# Patient Record
Sex: Male | Born: 1995 | Hispanic: Yes | Marital: Single | Smoking: Current some day smoker
Health system: Southern US, Community
[De-identification: ages and names within clinical notes are randomized; demographics above are authoritative.]

## PROBLEM LIST (undated history)

## (undated) DIAGNOSIS — R000 Tachycardia, unspecified: Secondary | ICD-10-CM

## (undated) HISTORY — PX: OTHER SURGICAL HISTORY: SHX169

## (undated) HISTORY — PX: FRACTURE SURGERY: SHX138

---
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
## 2013-11-20 ENCOUNTER — Encounter (HOSPITAL_COMMUNITY): Payer: Self-pay | Admitting: Emergency Medicine

## 2013-11-20 ENCOUNTER — Emergency Department (HOSPITAL_COMMUNITY)
Admission: EM | Admit: 2013-11-20 | Discharge: 2013-11-21 | Disposition: A | Discharge status: Home / Self Care | Payer: PRIVATE HEALTH INSURANCE | Attending: Emergency Medicine | Admitting: Emergency Medicine

## 2013-11-20 DIAGNOSIS — H9209 Otalgia, unspecified ear: Secondary | ICD-10-CM | POA: Insufficient documentation

## 2013-11-20 DIAGNOSIS — Y939 Activity, unspecified: Secondary | ICD-10-CM | POA: Insufficient documentation

## 2013-11-20 DIAGNOSIS — H60399 Other infective otitis externa, unspecified ear: Secondary | ICD-10-CM | POA: Insufficient documentation

## 2013-11-20 DIAGNOSIS — Z792 Long term (current) use of antibiotics: Secondary | ICD-10-CM | POA: Insufficient documentation

## 2013-11-20 DIAGNOSIS — IMO0002 Reserved for concepts with insufficient information to code with codable children: Secondary | ICD-10-CM | POA: Insufficient documentation

## 2013-11-20 DIAGNOSIS — F17200 Nicotine dependence, unspecified, uncomplicated: Secondary | ICD-10-CM | POA: Insufficient documentation

## 2013-11-20 DIAGNOSIS — T169XXA Foreign body in ear, unspecified ear, initial encounter: Secondary | ICD-10-CM | POA: Insufficient documentation

## 2013-11-20 DIAGNOSIS — T161XXA Foreign body in right ear, initial encounter: Secondary | ICD-10-CM

## 2013-11-20 DIAGNOSIS — Y929 Unspecified place or not applicable: Secondary | ICD-10-CM | POA: Insufficient documentation

## 2013-11-20 DIAGNOSIS — H6091 Unspecified otitis externa, right ear: Secondary | ICD-10-CM

## 2013-11-20 NOTE — ED Provider Notes (Signed)
CSN: [id number]     Arrival date & time 11/20/13  2206 History  This chart was scribed for Winifred Olive, working with Toy Cookey, MD found by Elon Spanner, ED Scribe. This patient was seen in room TR11C/TR11C and the patient's care was started at 11:48 PM.    Chief Complaint  Patient presents with  . Otalgia   The history is provided by the patient. No language interpreter was used.    HPI Comments: Dennis Atkins is a 18 y.o. male who presents to the Emergency Department complaining of 1 month of gradually worsening right otalgia.  Patient states his ear "popped" recently with pus drainage.  He has used peroxide to periodically clean the affected ear.     History reviewed. No pertinent past medical history. Past Surgical History  Procedure Laterality Date  . Fracture surgery    . Arm surgery     History reviewed. No pertinent family history. History  Substance Use Topics  . Smoking status: Current Every Day Smoker -- 0.50 packs/day  . Smokeless tobacco: Not on file  . Alcohol Use: No    Review of Systems  HENT: Positive for ear discharge and ear pain.   All other systems reviewed and are negative.     Allergies  Review of patient's allergies indicates no known allergies.  Home Medications   Prior to Admission medications   Medication Sig Start Date End Date Taking? Authorizing Provider  clindamycin (CLEOCIN) 300 MG capsule Take 300 mg by mouth 4 (four) times daily. 09/04/12   Ward Givens, MD  HYDROcodone-acetaminophen (NORCO/VICODIN) 5-325 MG per tablet Take 1-2 tablets by mouth every 6 (six) hours as needed for pain. Take 1 or 2 po Q 6hrs for pain 09/04/12   Ward Givens, MD   BP 141/71  Pulse 75  Temp(Src) 98.2 F (36.8 C) (Oral)  Resp 18  SpO2 98% Physical Exam  Nursing note and vitals reviewed. Constitutional: He is oriented to person, place, and time. He appears well-developed and well-nourished. No distress.  HENT:  Head: Normocephalic and  atraumatic.  Clear, soft plastic object noted in right external ear canal. Left ear unremarkable.   Eyes: Conjunctivae are normal.  Neck: Normal range of motion.  Cardiovascular: Normal rate and regular rhythm.  Exam reveals no gallop and no friction rub.   No murmur heard. Pulmonary/Chest: Effort normal and breath sounds normal. He has no wheezes. He has no rales. He exhibits no tenderness.  Musculoskeletal: Normal range of motion.  Neurological: He is alert and oriented to person, place, and time. Coordination normal.  Speech is goal-oriented. Moves limbs without ataxia.   Skin: Skin is warm and dry.  Psychiatric: He has a normal mood and affect. His behavior is normal.    ED Course  FOREIGN BODY REMOVAL Date/Time: 11/21/2013 12:20 AM Performed by: Emilia Beck Authorized by: Emilia Beck Consent: Verbal consent obtained. Risks and benefits: risks, benefits and alternatives were discussed Consent given by: patient Patient understanding: patient states understanding of the procedure being performed Patient consent: the patient's understanding of the procedure matches consent given Patient identity confirmed: verbally with patient Body area: ear Location details: right ear Patient sedated: no Patient restrained: no Patient cooperative: yes Localization method: visualized Removal mechanism: forceps Complexity: simple 1 objects recovered. Objects recovered: plastic ear bud Post-procedure assessment: foreign body removed Patient tolerance: Patient tolerated the procedure well with no immediate complications.   (including critical care time)  DIAGNOSTIC STUDIES: Oxygen Saturation is 98% on RA,  normal by my interpretation.    COORDINATION OF CARE:  11:53 PM Discussed treatment plan with patient.  Patient acknowledges and agrees with plan.    Labs Review Labs Reviewed - No data to display  Imaging Review No results found.   EKG Interpretation None       MDM   Final diagnoses:  Foreign body in ear, right, initial encounter  Otitis externa of right ear    12:19 AM Patient had an ear bud in his right ear that I removed. TM intact after removal. Patient will have antibiotic ear drops for otitis externa. Vitals stable and patient afebrile.   I personally performed the services described in this documentation, which was scribed in my presence. The recorded information has been reviewed and is accurate.    Emilia BeckKaitlyn Kalyn Hofstra, PA-C 11/21/13 0031

## 2013-11-20 NOTE — ED Notes (Signed)
Presents with right ear pain-pt reports purulent drainage from ear. Uses q tips at home

## 2013-11-21 MED ORDER — NEOMYCIN-POLYMYXIN-HC 3.5-10000-1 OT SUSP: 4.0000 [drp] | Freq: Three times a day (TID) | OTIC | Status: DC

## 2013-11-21 NOTE — ED Provider Notes (Signed)
Medical screening examination/treatment/procedure(s) were performed by non-physician practitioner and as supervising physician I was immediately available for consultation/collaboration.  Avalie Oconnor, MD 11/21/13 2034 

## 2013-11-21 NOTE — ED Notes (Signed)
Declined W/C at D/C and was escorted to lobby by RN. 

## 2013-11-21 NOTE — Discharge Instructions (Signed)
Use antibiotic ear drops until symptoms resolve. Refer to attached documents for more information.

## 2013-12-10 ENCOUNTER — Emergency Department (HOSPITAL_COMMUNITY)
Admission: EM | Admit: 2013-12-10 | Discharge: 2013-12-10 | Disposition: A | Discharge status: Home / Self Care | Payer: PRIVATE HEALTH INSURANCE | Attending: Emergency Medicine | Admitting: Emergency Medicine

## 2013-12-10 ENCOUNTER — Encounter (HOSPITAL_COMMUNITY): Payer: Self-pay | Admitting: Emergency Medicine

## 2013-12-10 DIAGNOSIS — F17200 Nicotine dependence, unspecified, uncomplicated: Secondary | ICD-10-CM | POA: Insufficient documentation

## 2013-12-10 DIAGNOSIS — H902 Conductive hearing loss, unspecified: Secondary | ICD-10-CM | POA: Insufficient documentation

## 2013-12-10 DIAGNOSIS — H748X9 Other specified disorders of middle ear and mastoid, unspecified ear: Secondary | ICD-10-CM | POA: Insufficient documentation

## 2013-12-10 DIAGNOSIS — H748X1 Other specified disorders of right middle ear and mastoid: Secondary | ICD-10-CM

## 2013-12-10 DIAGNOSIS — H9209 Otalgia, unspecified ear: Secondary | ICD-10-CM | POA: Insufficient documentation

## 2013-12-10 DIAGNOSIS — Z792 Long term (current) use of antibiotics: Secondary | ICD-10-CM | POA: Insufficient documentation

## 2013-12-10 MED ORDER — ONDANSETRON HCL 4 MG PO TABS: 4.0000 mg | ORAL_TABLET | Freq: Four times a day (QID) | ORAL | Status: DC

## 2013-12-10 MED ORDER — HYDROCODONE-ACETAMINOPHEN 5-325 MG PO TABS: 1.0000 | ORAL_TABLET | ORAL | Status: DC | PRN

## 2013-12-10 MED ORDER — AMOXICILLIN 500 MG PO CAPS: 1000.0000 mg | ORAL_CAPSULE | Freq: Two times a day (BID) | ORAL | Status: DC

## 2013-12-10 MED ORDER — ONDANSETRON 4 MG PO TBDP
8.0000 mg | ORAL_TABLET | Freq: Once | ORAL | Status: AC
  Administered 2013-12-10: 8 mg via ORAL
  Filled 2013-12-10: qty 2

## 2013-12-10 NOTE — Discharge Instructions (Signed)
CALL DR. BYERS' OFFICE IN THE MORNING TO BE SEEN IN HIS OFFICE TOMORROW FOR FURTHER EVALUATION AND TREATMENT OF EAR PAIN.

## 2013-12-10 NOTE — ED Notes (Signed)
PT ambulated with baseline gait; VSS; A&Ox3; no signs of distress; respirations even and unlabored; skin warm and dry; no questions upon discharge.  

## 2013-12-10 NOTE — ED Notes (Signed)
Pt in c/o right earache, states he was seen recently after having an earbud stuck in his ear for a month, that was removed, since that time patient has noted drainage from ear and blood today, still c/o pain to area

## 2013-12-10 NOTE — ED Provider Notes (Signed)
CSN: [id number]     Arrival date & time 12/10/13  1937 History  This chart was scribed for Elpidio Anis, PA-C working with Samuel Jester, DO by Evon Slack, ED Scribe. This patient was seen in room TR06C/TR06C and the patient's care was started at 7:45 PM.    Chief Complaint  Patient presents with  . Otalgia   Patient is a 18 y.o. male presenting with ear pain. The history is provided by the patient. No language interpreter was used.  Otalgia Associated symptoms: ear discharge and hearing loss    HPI Comments: Dennis Atkins is a 18 y.o. male who presents to the Emergency Department complaining of gradually worsening right ear pain onset 3 weeks prior. He states he has associated ear drainage, ear bleeding, decreased hearing out the right ear. He states that he can not hear out of the right ear. He state he recently had an ear plug stuck in his ear that was removed in the ED. He states that he received antibiotics and ear drops with no relief.   History reviewed. No pertinent past medical history. Past Surgical History  Procedure Laterality Date  . Fracture surgery    . Arm surgery     History reviewed. No pertinent family history. History  Substance Use Topics  . Smoking status: Current Every Day Smoker -- 0.50 packs/day  . Smokeless tobacco: Not on file  . Alcohol Use: No    Review of Systems  HENT: Positive for ear discharge, ear pain and hearing loss.     Allergies  Review of patient's allergies indicates no known allergies.  Home Medications   Prior to Admission medications   Medication Sig Start Date End Date Taking? Authorizing Provider  clindamycin (CLEOCIN) 300 MG capsule Take 300 mg by mouth 4 (four) times daily. 09/04/12   Ward Givens, MD  HYDROcodone-acetaminophen (NORCO/VICODIN) 5-325 MG per tablet Take 1-2 tablets by mouth every 6 (six) hours as needed for pain. Take 1 or 2 po Q 6hrs for pain 09/04/12   Ward Givens, MD  neomycin-polymyxin-hydrocortisone  (CORTISPORIN) 3.5-10000-1 otic suspension Place 4 drops into the right ear 3 (three) times daily. 11/21/13   Emilia Beck, PA-C   Triage Vitals: BP 136/66  Pulse 81  Temp(Src) 97.8 F (36.6 C) (Oral)  Resp 20  SpO2 98%  Physical Exam  Nursing note and vitals reviewed. Constitutional: He is oriented to person, place, and time. He appears well-developed and well-nourished. No distress.  HENT:  Head: Normocephalic and atraumatic.  Right hemotympanum minimal bleeding in to external can. No pre or post auricular adenopathy  Eyes: Conjunctivae and EOM are normal.  Neck: Neck supple. No tracheal deviation present.  Cardiovascular: Normal rate.   Pulmonary/Chest: Effort normal. No respiratory distress.  Musculoskeletal: Normal range of motion.  Neurological: He is alert and oriented to person, place, and time.  Skin: Skin is warm and dry.  Psychiatric: He has a normal mood and affect. His behavior is normal.    ED Course  Procedures (including critical care time) DIAGNOSTIC STUDIES: Oxygen Saturation is 98% on RA, normal by my interpretation.    COORDINATION OF CARE:    Labs Review Labs Reviewed - No data to display  Imaging Review No results found.   EKG Interpretation None      MDM   Final diagnoses:  None   1. Right hemotympanum  Discussed with Dr. Jearld Fenton, ENT, who advises abx and office follow up tomorrow. The patient is now complaining of  nausea, very mild dizziness. Zofran provided. Stable for discharge.      I personally performed the services described in this documentation, which was scribed in my presence. The recorded information has been reviewed and is accurate.       Arnoldo Hooker, PA-C 12/10/13 2015

## 2013-12-12 NOTE — ED Provider Notes (Signed)
Medical screening examination/treatment/procedure(s) were performed by non-physician practitioner and as supervising physician I was immediately available for consultation/collaboration.   EKG Interpretation None        Samuel Jester, DO 12/12/13 2020

## 2014-11-20 ENCOUNTER — Encounter (HOSPITAL_COMMUNITY): Payer: Self-pay | Admitting: Emergency Medicine

## 2014-11-20 ENCOUNTER — Emergency Department (HOSPITAL_COMMUNITY)
Admission: EM | Admit: 2014-11-20 | Discharge: 2014-11-20 | Disposition: A | Discharge status: Home / Self Care | Payer: Self-pay | Attending: Emergency Medicine | Admitting: Emergency Medicine

## 2014-11-20 DIAGNOSIS — Z792 Long term (current) use of antibiotics: Secondary | ICD-10-CM | POA: Insufficient documentation

## 2014-11-20 DIAGNOSIS — R112 Nausea with vomiting, unspecified: Secondary | ICD-10-CM | POA: Insufficient documentation

## 2014-11-20 DIAGNOSIS — Z720 Tobacco use: Secondary | ICD-10-CM | POA: Insufficient documentation

## 2014-11-20 DIAGNOSIS — L03114 Cellulitis of left upper limb: Secondary | ICD-10-CM | POA: Insufficient documentation

## 2014-11-20 DIAGNOSIS — L02414 Cutaneous abscess of left upper limb: Secondary | ICD-10-CM | POA: Insufficient documentation

## 2014-11-20 DIAGNOSIS — L0291 Cutaneous abscess, unspecified: Secondary | ICD-10-CM

## 2014-11-20 DIAGNOSIS — L0390 Cellulitis, unspecified: Secondary | ICD-10-CM

## 2014-11-20 MED ORDER — SULFAMETHOXAZOLE-TRIMETHOPRIM 800-160 MG PO TABS: 1.0000 | ORAL_TABLET | Freq: Two times a day (BID) | ORAL | Status: AC

## 2014-11-20 MED ORDER — PROMETHAZINE HCL 25 MG PO TABS: 25.0000 mg | ORAL_TABLET | Freq: Four times a day (QID) | ORAL | Status: DC | PRN

## 2014-11-20 MED ORDER — HYDROCODONE-ACETAMINOPHEN 5-325 MG PO TABS
2.0000 | ORAL_TABLET | Freq: Once | ORAL | Status: AC
  Administered 2014-11-20: 2 via ORAL
  Filled 2014-11-20: qty 2

## 2014-11-20 MED ORDER — IBUPROFEN 800 MG PO TABS
800.0000 mg | ORAL_TABLET | Freq: Once | ORAL | Status: AC
  Administered 2014-11-20: 800 mg via ORAL
  Filled 2014-11-20: qty 1

## 2014-11-20 MED ORDER — ONDANSETRON 4 MG PO TBDP
4.0000 mg | ORAL_TABLET | Freq: Once | ORAL | Status: AC
  Administered 2014-11-20: 4 mg via ORAL
  Filled 2014-11-20: qty 1

## 2014-11-20 MED ORDER — SULFAMETHOXAZOLE-TRIMETHOPRIM 800-160 MG PO TABS
1.0000 | ORAL_TABLET | Freq: Once | ORAL | Status: AC
  Administered 2014-11-20: 1 via ORAL
  Filled 2014-11-20: qty 1

## 2014-11-20 MED ORDER — IBUPROFEN 800 MG PO TABS: 800.0000 mg | ORAL_TABLET | Freq: Three times a day (TID) | ORAL | Status: DC | PRN

## 2014-11-20 MED ORDER — HYDROCODONE-ACETAMINOPHEN 5-325 MG PO TABS: 1.0000 | ORAL_TABLET | ORAL | Status: DC | PRN

## 2014-11-20 NOTE — ED Notes (Signed)
Patient reports started vomiting and feeling fatigued after moving crates at work on Wednesday. Reports shortly after he noticed he was bit by something on his left upper arm. Complaining or redness and swelling to area. Also reports area is hot to touch.

## 2014-11-20 NOTE — ED Provider Notes (Addendum)
TIME SEEN: 5:30 AM    CHIEF COMPLAINT: Fatigue, nausea and vomiting, left arm pain and redness  HPI: Pt is a 19 y.o. male who presents emergency department with a left upper extremity abscess and cellulitis. States that 2 days ago he noticed a small pimple to the left upper arm. He states he popped this area with pus drainage. He states he has had erythema and warmth around this area. States he also felt tired and has had intermittent nausea and vomiting. Last episode of vomiting was 8 PM yesterday. No abdominal pain or diarrhea. No known fever. He is not a diabetic. Has had surgery to the left forearm in the past. No hardware in the humerus. No history of injury to the arm. States he is not sure if he was bit by something but does not remember being bit by anything.  ROS: See HPI Constitutional: no fever  Eyes: no drainage  ENT: no runny nose   Cardiovascular:  no chest pain  Resp: no SOB  GI:  vomiting GU: no dysuria Integumentary: no rash  Allergy: no hives  Musculoskeletal: no leg swelling  Neurological: no slurred speech ROS otherwise negative  PAST MEDICAL HISTORY/PAST SURGICAL HISTORY:  History reviewed. No pertinent past medical history.  MEDICATIONS:  Prior to Admission medications   Medication Sig Start Date End Date Taking? Authorizing Provider  amoxicillin (AMOXIL) 500 MG capsule Take 2 capsules (1,000 mg total) by mouth 2 (two) times daily. 12/10/13   Elpidio Anis, PA-C  HYDROcodone-acetaminophen (NORCO/VICODIN) 5-325 MG per tablet Take 1 tablet by mouth every 4 (four) hours as needed. 11/20/14   Kristen N Ward, DO  ibuprofen (ADVIL,MOTRIN) 800 MG tablet Take 1 tablet (800 mg total) by mouth every 8 (eight) hours as needed for mild pain. 11/20/14   Kristen N Ward, DO  ondansetron (ZOFRAN) 4 MG tablet Take 1 tablet (4 mg total) by mouth every 6 (six) hours. 12/10/13   Elpidio Anis, PA-C  promethazine (PHENERGAN) 25 MG tablet Take 1 tablet (25 mg total) by mouth every 6 (six)  hours as needed for nausea or vomiting. 11/20/14   Kristen N Ward, DO  sulfamethoxazole-trimethoprim (BACTRIM DS,SEPTRA DS) 800-160 MG per tablet Take 1 tablet by mouth 2 (two) times daily. 11/20/14 11/27/14  Kristen N Ward, DO    ALLERGIES:  No Known Allergies  SOCIAL HISTORY:  Social History  Substance Use Topics  . Smoking status: Current Every Day Smoker -- 0.50 packs/day  . Smokeless tobacco: Not on file  . Alcohol Use: No    FAMILY HISTORY: History reviewed. No pertinent family history.  EXAM: BP 125/79 mmHg  Pulse 91  Temp(Src) 98 F (36.7 C) (Oral)  Resp 18  Ht  (1.626 m)  Wt 190 lb (86.183 kg)  BMI 32.60 kg/m2  SpO2 99% CONSTITUTIONAL: Alert and oriented and responds appropriately to questions. Well-appearing; well-nourished, afebrile, nontoxic, appears well-hydrated HEAD: Normocephalic EYES: Conjunctivae clear, PERRL ENT: normal nose; no rhinorrhea; moist mucous membranes; pharynx without lesions noted NECK: Supple, no meningismus, no LAD  CARD: RRR; S1 and S2 appreciated; no murmurs, no clicks, no rubs, no gallops RESP: Normal chest excursion without splinting or tachypnea; breath sounds clear and equal bilaterally; no wheezes, no rhonchi, no rales, no hypoxia or respiratory distress, speaking full sentences ABD/GI: Normal bowel sounds; non-distended; soft, non-tender, no rebound, no guarding, no peritoneal signs BACK:  The back appears normal and is non-tender to palpation, there is no CVA tenderness EXT: Normal ROM in all joints;  non-tender to palpation; no edema; normal capillary refill; no cyanosis, no calf tenderness or swelling    SKIN: Normal color for age and race; warm, patient has a small papular lesion to the upper left arm with a 1cm area of induration without fluctuance and no active drainage, there is a 4 x 7 cm area of erythema surrounding this area of induration that has been outlined with a tissue marker, no urticaria, no blisters or desquamation,  no petechiae or purpura NEURO: Moves all extremities equally, sensation to light touch intact diffusely, cranial nerves II through XII intact PSYCH: The patient's mood and manner are appropriate. Grooming and personal hygiene are appropriate.  MEDICAL DECISION MAKING: Patient here with an abscess that has already drained on its own and does not require further incision and drainage today and surrounding cellulitis. He is otherwise very well-appearing, afebrile and nontoxic. He appears well hydrated on exam. Will discharge on Bactrim for the next week and with prescriptions for pain and nausea medicine. Have outlined his area of cellulitis with a tissue marker. Have discussed with patient usual and customary return precautions. We'll also provide him with outpatient follow-up information. He verbalizes understanding and is comfortable with this plan.       Layla Maw Ward, DO 11/20/14 0546  Layla Maw Ward, DO 11/20/14 4782

## 2014-11-20 NOTE — Discharge Instructions (Signed)
Abscess °Care After °An abscess (also called a boil or furuncle) is an infected area that contains a collection of pus. Signs and symptoms of an abscess include pain, tenderness, redness, or hardness, or you may feel a moveable soft area under your skin. An abscess can occur anywhere in the body. The infection may spread to surrounding tissues causing cellulitis. A cut (incision) by the surgeon was made over your abscess and the pus was drained out. Gauze may have been packed into the space to provide a drain that will allow the cavity to heal from the inside outwards. The boil may be painful for 5 to 7 days. Most people with a boil do not have high fevers. Your abscess, if seen early, may not have localized, and may not have been lanced. If not, another appointment may be required for this if it does not get better on its own or with medications. °HOME CARE INSTRUCTIONS  °· Only take over-the-counter or prescription medicines for pain, discomfort, or fever as directed by your caregiver. °· When you bathe, soak and then remove gauze or iodoform packs at least daily or as directed by your caregiver. You may then wash the wound gently with mild soapy water. Repack with gauze or do as your caregiver directs. °SEEK IMMEDIATE MEDICAL CARE IF:  °· You develop increased pain, swelling, redness, drainage, or bleeding in the wound site. °· You develop signs of generalized infection including muscle aches, chills, fever, or a general ill feeling. °· An oral temperature above 102° F (38.9° C) develops, not controlled by medication. °See your caregiver for a recheck if you develop any of the symptoms described above. If medications (antibiotics) were prescribed, take them as directed. °Document Released: 10/13/2004 Document Revised: 06/19/2011 Document Reviewed: 06/10/2007 °[employer]® Patient Information ©2015 [employer]. This information is not intended to replace advice given to you by your health care provider. Make sure  you discuss any questions you have with your health care provider. ° °Cellulitis °Cellulitis is an infection of the skin and the tissue beneath it. The infected area is usually red and tender. Cellulitis occurs most often in the arms and lower legs.  °CAUSES  °Cellulitis is caused by bacteria that enter the skin through cracks or cuts in the skin. The most common types of bacteria that cause cellulitis are staphylococci and streptococci. °SIGNS AND SYMPTOMS  °· Redness and warmth. °· Swelling. °· Tenderness or pain. °· Fever. °DIAGNOSIS  °Your health care provider can usually determine what is wrong based on a physical exam. Blood tests may also be done. °TREATMENT  °Treatment usually involves taking an antibiotic medicine. °HOME CARE INSTRUCTIONS  °· Take your antibiotic medicine as directed by your health care provider. Finish the antibiotic even if you start to feel better. °· Keep the infected arm or leg elevated to reduce swelling. °· Apply a warm cloth to the affected area up to 4 times per day to relieve pain. °· Take medicines only as directed by your health care provider. °· Keep all follow-up visits as directed by your health care provider. °SEEK MEDICAL CARE IF:  °· You notice red streaks coming from the infected area. °· Your red area gets larger or turns dark in color. °· Your bone or joint underneath the infected area becomes painful after the skin has healed. °· Your infection returns in the same area or another area. °· You notice a swollen bump in the infected area. °· You develop new symptoms. °· You have   a fever. °SEEK IMMEDIATE MEDICAL CARE IF:  °· You feel very sleepy. °· You develop vomiting or diarrhea. °· You have a general ill feeling (malaise) with muscle aches and pains. °MAKE SURE YOU:  °· Understand these instructions. °· Will watch your condition. °· Will get help right away if you are not doing well or get worse. °Document Released: 01/04/2005 Document Revised: 08/11/2013 Document  Reviewed: 06/12/2011 °[employer]® Patient Information ©2015 [employer]. This information is not intended to replace advice given to you by your health care provider. Make sure you discuss any questions you have with your health care provider. ° °

## 2014-11-21 ENCOUNTER — Encounter (HOSPITAL_COMMUNITY): Payer: Self-pay | Admitting: Emergency Medicine

## 2014-11-21 ENCOUNTER — Emergency Department (HOSPITAL_COMMUNITY)
Admission: EM | Admit: 2014-11-21 | Discharge: 2014-11-21 | Disposition: A | Discharge status: Home / Self Care | Payer: Self-pay | Attending: Emergency Medicine | Admitting: Emergency Medicine

## 2014-11-21 DIAGNOSIS — Z792 Long term (current) use of antibiotics: Secondary | ICD-10-CM | POA: Insufficient documentation

## 2014-11-21 DIAGNOSIS — L03114 Cellulitis of left upper limb: Secondary | ICD-10-CM | POA: Insufficient documentation

## 2014-11-21 DIAGNOSIS — Z720 Tobacco use: Secondary | ICD-10-CM | POA: Insufficient documentation

## 2014-11-21 MED ORDER — CEPHALEXIN 500 MG PO CAPS: 500.0000 mg | ORAL_CAPSULE | Freq: Four times a day (QID) | ORAL | Status: DC

## 2014-11-21 MED ORDER — OXYCODONE-ACETAMINOPHEN 5-325 MG PO TABS: 1.0000 | ORAL_TABLET | Freq: Four times a day (QID) | ORAL | Status: DC | PRN

## 2014-11-21 MED ORDER — OXYCODONE-ACETAMINOPHEN 5-325 MG PO TABS
1.0000 | ORAL_TABLET | Freq: Once | ORAL | Status: AC
  Administered 2014-11-21: 1 via ORAL
  Filled 2014-11-21: qty 1

## 2014-11-21 MED ORDER — CEPHALEXIN 500 MG PO CAPS
500.0000 mg | ORAL_CAPSULE | Freq: Once | ORAL | Status: AC
  Administered 2014-11-21: 500 mg via ORAL
  Filled 2014-11-21: qty 1

## 2014-11-21 NOTE — ED Notes (Signed)
Pt. Reports abscess to left upper arm.

## 2014-11-21 NOTE — Discharge Instructions (Signed)

## 2014-11-23 NOTE — ED Provider Notes (Signed)
CSN: [id number]     Arrival date & time 11/21/14  2002 History   First MD Initiated Contact with Patient 11/21/14 2017     Chief Complaint  Patient presents with  . Abscess     (Consider location/radiation/quality/duration/timing/severity/associated sxs/prior Treatment) HPI  Dennis Atkins is a 19 y.o. male who returns to the ED c/o pain and redness of the left upper arm.  He was seen here one day ago for same.  He reports worsening pain and redness.  He contributes his symptoms to a possible insect or spider bite although does recall an actual bite.  Pain to his arm is worse with movement.  He was treated with pain medication and  Bactrim.  He is currently taking the antibiotic but has recently taken his last pain pill.  He states the redness is improving but pain is worsening.  He denies swelling, fever, chills, numbness or weakness of the extremity.     History reviewed. No pertinent past medical history. Past Surgical History  Procedure Laterality Date  . Fracture surgery    . Arm surgery     No family history on file. Social History  Substance Use Topics  . Smoking status: Current Every Day Smoker -- 0.50 packs/day  . Smokeless tobacco: None  . Alcohol Use: No    Review of Systems  Constitutional: Negative for fever and chills.  Gastrointestinal: Negative for nausea and vomiting.  Musculoskeletal: Positive for myalgias. Negative for joint swelling.  Skin: Positive for color change.       Redness and pain to left upper arm  Neurological: Negative for weakness and numbness.  Hematological: Negative for adenopathy.  All other systems reviewed and are negative.     Allergies  Review of patient's allergies indicates no known allergies.  Home Medications   Prior to Admission medications   Medication Sig Start Date End Date Taking? Authorizing Provider  amoxicillin (AMOXIL) 500 MG capsule Take 2 capsules (1,000 mg total) by mouth 2 (two) times daily. 12/10/13   Elpidio Anis, PA-C  cephALEXin (KEFLEX) 500 MG capsule Take 1 capsule (500 mg total) by mouth 4 (four) times daily. For 7 days 11/21/14   Jovan Schickling, PA-C  HYDROcodone-acetaminophen (NORCO/VICODIN) 5-325 MG per tablet Take 1 tablet by mouth every 4 (four) hours as needed. 11/20/14   Kristen N Ward, DO  ibuprofen (ADVIL,MOTRIN) 800 MG tablet Take 1 tablet (800 mg total) by mouth every 8 (eight) hours as needed for mild pain. 11/20/14   Kristen N Ward, DO  ondansetron (ZOFRAN) 4 MG tablet Take 1 tablet (4 mg total) by mouth every 6 (six) hours. 12/10/13   Elpidio Anis, PA-C  oxyCODONE-acetaminophen (PERCOCET/ROXICET) 5-325 MG per tablet Take 1 tablet by mouth every 6 (six) hours as needed. 11/21/14   Keslyn Teater, PA-C  promethazine (PHENERGAN) 25 MG tablet Take 1 tablet (25 mg total) by mouth every 6 (six) hours as needed for nausea or vomiting. 11/20/14   Kristen N Ward, DO  sulfamethoxazole-trimethoprim (BACTRIM DS,SEPTRA DS) 800-160 MG per tablet Take 1 tablet by mouth 2 (two) times daily. 11/20/14 11/27/14  Kristen N Ward, DO   BP 151/68 mmHg  Pulse 98  Temp(Src) 99.1 F (37.3 C) (Oral)  Resp 16  SpO2 97% Physical Exam  Constitutional: He is oriented to person, place, and time. He appears well-developed and well-nourished. No distress.  HENT:  Head: Normocephalic and atraumatic.  Cardiovascular: Normal rate, regular rhythm and normal heart sounds.   No murmur heard. Pulmonary/Chest:  Effort normal and breath sounds normal. No respiratory distress.  Musculoskeletal: He exhibits tenderness.  Tenderness with abduction of the left arm.  Radial pulses equal bilat.  Distal sensation intact.    Neurological: He is alert and oriented to person, place, and time. He exhibits normal muscle tone. Coordination normal.  Skin: Skin is warm and dry. There is erythema.  Localized erythema and mild induration of the left UE.  Erythema improved from previous marking. No abscess  Nursing note and vitals  reviewed.   ED Course  Procedures (including critical care time) Labs Review Labs Reviewed - No data to display  Imaging Review  EKG Interpretation None      MDM   Final diagnoses:  Cellulitis of left upper extremity    Pt is well appearing.  Localized cellulitis.  Currently taking bactrim, will add keflex.  He is non-toxic appearing.  NV intact.  No abscess at present.  Advised to arrange PMD f/u as previously advised.  Appears stable for d/c    Pauline Aus, PA-C 11/23/14 1727  Doug Sou, MD 11/30/14 1459

## 2015-08-18 ENCOUNTER — Emergency Department (HOSPITAL_COMMUNITY)
Admission: EM | Admit: 2015-08-18 | Discharge: 2015-08-18 | Disposition: A | Discharge status: Home / Self Care | Payer: Self-pay | Attending: Emergency Medicine | Admitting: Emergency Medicine

## 2015-08-18 ENCOUNTER — Encounter (HOSPITAL_COMMUNITY): Payer: Self-pay | Admitting: Emergency Medicine

## 2015-08-18 DIAGNOSIS — L03311 Cellulitis of abdominal wall: Secondary | ICD-10-CM | POA: Insufficient documentation

## 2015-08-18 DIAGNOSIS — L02211 Cutaneous abscess of abdominal wall: Secondary | ICD-10-CM | POA: Insufficient documentation

## 2015-08-18 DIAGNOSIS — F17200 Nicotine dependence, unspecified, uncomplicated: Secondary | ICD-10-CM | POA: Insufficient documentation

## 2015-08-18 DIAGNOSIS — Z79899 Other long term (current) drug therapy: Secondary | ICD-10-CM | POA: Insufficient documentation

## 2015-08-18 DIAGNOSIS — Z792 Long term (current) use of antibiotics: Secondary | ICD-10-CM | POA: Insufficient documentation

## 2015-08-18 DIAGNOSIS — L0390 Cellulitis, unspecified: Secondary | ICD-10-CM

## 2015-08-18 DIAGNOSIS — L0291 Cutaneous abscess, unspecified: Secondary | ICD-10-CM

## 2015-08-18 MED ORDER — LIDOCAINE HCL 2 % IJ SOLN
10.0000 mL | Freq: Once | INTRAMUSCULAR | Status: AC
  Administered 2015-08-18: 200 mg via INTRADERMAL
  Filled 2015-08-18: qty 20

## 2015-08-18 MED ORDER — SULFAMETHOXAZOLE-TRIMETHOPRIM 800-160 MG PO TABS
1.0000 | ORAL_TABLET | Freq: Once | ORAL | Status: AC
  Administered 2015-08-18: 1 via ORAL
  Filled 2015-08-18: qty 1

## 2015-08-18 MED ORDER — HYDROCODONE-ACETAMINOPHEN 5-325 MG PO TABS
1.0000 | ORAL_TABLET | Freq: Once | ORAL | Status: AC
  Administered 2015-08-18: 1 via ORAL
  Filled 2015-08-18: qty 1

## 2015-08-18 MED ORDER — SULFAMETHOXAZOLE-TRIMETHOPRIM 800-160 MG PO TABS: 1.0000 | ORAL_TABLET | Freq: Two times a day (BID) | ORAL | Status: DC

## 2015-08-18 NOTE — ED Notes (Signed)
Pt. reports " insect bite" skin abscess with mild drainage at lower umbilicus this morning .

## 2015-08-18 NOTE — ED Notes (Signed)
Pt to be discharged after his ride has arrived. Due to pain narcotic administration

## 2015-08-18 NOTE — ED Provider Notes (Signed)
CSN: [id number]     Arrival date & time 08/18/15  1907 History  By signing my name below, I, Dennis Atkins, attest that this documentation has been prepared under the direction and in the presence of non-physician practitioner, Wynetta EmeryNicole Arrietty Dercole, PA-C. Electronically Signed: Freida Busmaniana Atkins, Scribe. 08/18/2015. 7:32 PM.   Chief Complaint  Patient presents with  . Abscess   The history is provided by the patient. No language interpreter was used.  HPI Comments:  Dennis Atkins is a 20 y.o. male who presents to the Emergency Department complaining of a boil to his lower abdomen. Pt states ~4-5 hours ago he felt something sting/bite him and soon after he noticed the boil. Pt has no other complaints or symptoms at this time. No alleviating factors noted.  NKDA  History reviewed. No pertinent past medical history. Past Surgical History  Procedure Laterality Date  . Fracture surgery    . Arm surgery     No family history on file. Social History  Substance Use Topics  . Smoking status: Current Every Day Smoker -- 0.50 packs/day  . Smokeless tobacco: None  . Alcohol Use: No    Review of Systems 10 systems reviewed and all are negative for acute change except as noted in the HPI.   Allergies  Review of patient's allergies indicates no known allergies.  Home Medications   Prior to Admission medications   Medication Sig Start Date End Date Taking? Authorizing Provider  amoxicillin (AMOXIL) 500 MG capsule Take 2 capsules (1,000 mg total) by mouth 2 (two) times daily. 12/10/13   Elpidio AnisShari Upstill, PA-C  cephALEXin (KEFLEX) 500 MG capsule Take 1 capsule (500 mg total) by mouth 4 (four) times daily. For 7 days 11/21/14   Tammy Triplett, PA-C  HYDROcodone-acetaminophen (NORCO/VICODIN) 5-325 MG per tablet Take 1 tablet by mouth every 4 (four) hours as needed. 11/20/14   Kristen N Ward, DO  ibuprofen (ADVIL,MOTRIN) 800 MG tablet Take 1 tablet (800 mg total) by mouth every 8 (eight) hours as needed for  mild pain. 11/20/14   Kristen N Ward, DO  ondansetron (ZOFRAN) 4 MG tablet Take 1 tablet (4 mg total) by mouth every 6 (six) hours. 12/10/13   Elpidio AnisShari Upstill, PA-C  oxyCODONE-acetaminophen (PERCOCET/ROXICET) 5-325 MG per tablet Take 1 tablet by mouth every 6 (six) hours as needed. 11/21/14   Tammy Triplett, PA-C  promethazine (PHENERGAN) 25 MG tablet Take 1 tablet (25 mg total) by mouth every 6 (six) hours as needed for nausea or vomiting. 11/20/14   Kristen N Ward, DO   BP 125/83 mmHg  Pulse 85  Temp(Src) 98.1 F (36.7 C) (Oral)  Resp 18  SpO2 96% Physical Exam  Constitutional: He is oriented to person, place, and time. He appears well-developed and well-nourished. No distress.  HENT:  Head: Normocephalic and atraumatic.  Eyes: Conjunctivae are normal.  Cardiovascular: Normal rate.   Pulmonary/Chest: Effort normal.  Abdominal: He exhibits no distension.  Neurological: He is alert and oriented to person, place, and time.  Skin: Skin is warm and dry. Rash noted.  1 cm fluctuant abscess with 3 cm of surrounding cellulitis  Psychiatric: He has a normal mood and affect.  Nursing note and vitals reviewed.   ED Course  .Marland Kitchen.Incision and Drainage Date/Time: 08/18/2015 8:11 PM Performed by: Wynetta EmeryPISCIOTTA, Eagan Shifflett Authorized by: Wynetta EmeryPISCIOTTA, Waqas Bruhl Consent: Verbal consent obtained. Risks and benefits: risks, benefits and alternatives were discussed Consent given by: patient Required items: required blood products, implants, devices, and special equipment available Patient identity confirmed:  verbally with patient Type: abscess Body area: trunk Anesthesia: local infiltration Local anesthetic: lidocaine 2% without epinephrine Anesthetic total: 2 ml Scalpel size: 11 Incision type: single straight Incision depth: dermal Complexity: complex Drainage: purulent Drainage amount: moderate Wound treatment: wound left open Packing material: none Patient tolerance: Patient tolerated the procedure well  with no immediate complications   DIAGNOSTIC STUDIES:  Oxygen Saturation is 96% on RA, normal by my interpretation.    COORDINATION OF CARE:  7:30 PM Discussed treatment plan with pt at bedside and pt agreed to plan.   MDM   Final diagnoses:  Abscess and cellulitis    Filed Vitals:   08/18/15 1921  BP: 125/83  Pulse: 85  Temp: 98.1 F (36.7 C)  TempSrc: Oral  Resp: 18  SpO2: 96%    Medications  HYDROcodone-acetaminophen (NORCO/VICODIN) 5-325 MG per tablet 1 tablet (1 tablet Oral Given 08/18/15 1942)  lidocaine (XYLOCAINE) 2 % (with pres) injection 200 mg (200 mg Intradermal Given by Other 08/18/15 1942)  sulfamethoxazole-trimethoprim (BACTRIM DS,SEPTRA DS) 800-160 MG per tablet 1 tablet (1 tablet Oral Given 08/18/15 1959)    Dennis Atkins is 20 y.o. male presenting with Abscess and mild surrounding cellulitis to abdomen. No signs of systemic infection, patient given Bactrim and I and D performed  Evaluation does not show pathology that would require ongoing emergent intervention or inpatient treatment. Pt is hemodynamically stable and mentating appropriately. Discussed findings and plan with patient/guardian, who agrees with care plan. All questions answered. Return precautions discussed and outpatient follow up given.   New Prescriptions   SULFAMETHOXAZOLE-TRIMETHOPRIM (BACTRIM DS) 800-160 MG TABLET    Take 1 tablet by mouth 2 (two) times daily.     I personally performed the services described in this documentation, which was scribed in my presence. The recorded information has been reviewed and is accurate.    Wynetta Emery, PA-C 08/18/15 2019  Marily Memos, MD 08/18/15 2111

## 2015-08-18 NOTE — Discharge Instructions (Signed)
Wash the area with soap and water morning and night, keep it covered. If you develop fever, increasing redness or increasing pain present to the emergency department for recheck.   Incision and Drainage Incision and drainage is a procedure in which a sac-like structure (cystic structure) is opened and drained. The area to be drained usually contains material such as pus, fluid, or blood.  LET YOUR CAREGIVER KNOW ABOUT:   Allergies to medicine.  Medicines taken, including vitamins, herbs, eyedrops, over-the-counter medicines, and creams.  Use of steroids (by mouth or creams).  Previous problems with anesthetics or numbing medicines.  History of bleeding problems or blood clots.  Previous surgery.  Other health problems, including diabetes and kidney problems.  Possibility of pregnancy, if this applies. RISKS AND COMPLICATIONS  Pain.  Bleeding.  Scarring.  Infection. BEFORE THE PROCEDURE  You may need to have an ultrasound or other imaging tests to see how large or deep your cystic structure is. Blood tests may also be used to determine if you have an infection or how severe the infection is. You may need to have a tetanus shot. PROCEDURE  The affected area is cleaned with a cleaning fluid. The cyst area will then be numbed with a medicine (local anesthetic). A small incision will be made in the cystic structure. A syringe or catheter may be used to drain the contents of the cystic structure, or the contents may be squeezed out. The area will then be flushed with a cleansing solution. After cleansing the area, it is often gently packed with a gauze or another wound dressing. Once it is packed, it will be covered with gauze and tape or some other type of wound dressing. AFTER THE PROCEDURE   Often, you will be allowed to go home right after the procedure.  You may be given antibiotic medicine to prevent or heal an infection.  If the area was packed with gauze or some other  wound dressing, you will likely need to come back in 1 to 2 days to get it removed.  The area should heal in about 14 days.   This information is not intended to replace advice given to you by your health care provider. Make sure you discuss any questions you have with your health care provider.   Document Released: 09/20/2000 Document Revised: 09/26/2011 Document Reviewed: 05/22/2011 Elsevier Interactive Patient Education [employer]2016 [employer] Inc.

## 2016-04-15 ENCOUNTER — Encounter: Payer: Self-pay | Admitting: *Deleted

## 2016-04-15 ENCOUNTER — Emergency Department
Admission: EM | Admit: 2016-04-15 | Discharge: 2016-04-15 | Disposition: A | Discharge status: Home / Self Care | Payer: Self-pay | Attending: Emergency Medicine | Admitting: Emergency Medicine

## 2016-04-15 DIAGNOSIS — Z5321 Procedure and treatment not carried out due to patient leaving prior to being seen by health care provider: Secondary | ICD-10-CM | POA: Insufficient documentation

## 2016-04-15 DIAGNOSIS — M542 Cervicalgia: Secondary | ICD-10-CM | POA: Insufficient documentation

## 2016-04-15 DIAGNOSIS — F17200 Nicotine dependence, unspecified, uncomplicated: Secondary | ICD-10-CM | POA: Insufficient documentation
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
## 2016-09-02 ENCOUNTER — Emergency Department: Payer: Self-pay

## 2016-09-02 ENCOUNTER — Encounter: Payer: Self-pay | Admitting: Emergency Medicine

## 2016-09-02 ENCOUNTER — Emergency Department
Admission: EM | Admit: 2016-09-02 | Discharge: 2016-09-02 | Disposition: A | Discharge status: Home / Self Care | Payer: Self-pay | Attending: Emergency Medicine | Admitting: Emergency Medicine

## 2016-09-02 DIAGNOSIS — Y998 Other external cause status: Secondary | ICD-10-CM | POA: Insufficient documentation

## 2016-09-02 DIAGNOSIS — S46212A Strain of muscle, fascia and tendon of other parts of biceps, left arm, initial encounter: Secondary | ICD-10-CM | POA: Insufficient documentation

## 2016-09-02 DIAGNOSIS — Y9389 Activity, other specified: Secondary | ICD-10-CM | POA: Insufficient documentation

## 2016-09-02 DIAGNOSIS — X503XXA Overexertion from repetitive movements, initial encounter: Secondary | ICD-10-CM | POA: Insufficient documentation

## 2016-09-02 DIAGNOSIS — M79602 Pain in left arm: Secondary | ICD-10-CM

## 2016-09-02 DIAGNOSIS — F17200 Nicotine dependence, unspecified, uncomplicated: Secondary | ICD-10-CM | POA: Insufficient documentation

## 2016-09-02 DIAGNOSIS — Y9259 Other trade areas as the place of occurrence of the external cause: Secondary | ICD-10-CM | POA: Insufficient documentation

## 2016-09-02 MED ORDER — DIAZEPAM 5 MG PO TABS: 5.0000 mg | ORAL_TABLET | Freq: Three times a day (TID) | ORAL | 0 refills | Status: DC | PRN

## 2016-09-02 MED ORDER — IBUPROFEN 800 MG PO TABS: 800.0000 mg | ORAL_TABLET | Freq: Three times a day (TID) | ORAL | 0 refills | Status: DC | PRN

## 2016-09-02 MED ORDER — OXYCODONE-ACETAMINOPHEN 5-325 MG PO TABS
2.0000 | ORAL_TABLET | Freq: Once | ORAL | Status: AC
  Administered 2016-09-02: 2 via ORAL
  Filled 2016-09-02: qty 2

## 2016-09-02 NOTE — ED Provider Notes (Signed)
[facility] Emergency Department Provider Note       Time seen: ----------------------------------------- 12:59 PM on 09/02/2016 -----------------------------------------     I have reviewed the triage vital signs and the nursing notes.   HISTORY   Chief Complaint Arm Pain    HPI Dennis Atkins is a 21 y.o. male who presents to the ED for left forearm pain. Patient reports repetitive use at work and had a history of surgery on the same arm. Pain is currently 10 out of 10, flexing the left arm makes his symptoms worse. He denies fevers, chills, recent heavy trauma or other complaints.   History reviewed. No pertinent past medical history.  There are no active problems to display for this patient.   Past Surgical History:  Procedure Laterality Date  . arm surgery    . FRACTURE SURGERY      Allergies Patient has no known allergies.  Social History Social History  Substance Use Topics  . Smoking status: Current Every Day Smoker    Packs/day: 0.50  . Smokeless tobacco: Never Used  . Alcohol use No    Review of Systems Constitutional: Negative for fever. Musculoskeletal: Positive for left arm pain Skin: Negative for rash. Neurological: Negative for headaches, focal weakness or numbness.  All systems negative/normal/unremarkable except as stated in the HPI  ____________________________________________   PHYSICAL EXAM:  VITAL SIGNS: ED Triage Vitals  Enc Vitals Group     BP 09/02/16 1251 (!) 151/72     Pulse Rate 09/02/16 1251 91     Resp 09/02/16 1251 18     Temp 09/02/16 1251 97.9 F (36.6 C)     Temp Source 09/02/16 1251 Oral     SpO2 09/02/16 1251 98 %     Weight 09/02/16 1252 220 lb (99.8 kg)     Height 09/02/16 1252 5\' 6"  (1.676 m)     Head Circumference --      Peak Flow --      Pain Score 09/02/16 1251 10     Pain Loc --      Pain Edu? --      Excl. in GC? --     Constitutional: Alert and oriented. Well appearing  and in no distress. Eyes: Conjunctivae are normal. Normal extraocular movements. Musculoskeletal: Pain in left upper extremity particularly with flexion of the biceps brachialis muscle. Also left forearm tenderness along an old surgery site. Neurologic:  Normal speech and language. No gross focal neurologic deficits are appreciated.  Skin:  Skin is warm, dry and intact. No rash noted. Psychiatric: Mood and affect are normal. Speech and behavior are normal.  ____________________________________________  ED COURSE:  Pertinent labs & imaging results that were available during my care of the patient were reviewed by me and considered in my medical decision making (see chart for details). Patient presents for left forearm pain, we will assess with imaging as indicated.   Procedures ____________________________________________   RADIOLOGY Images were viewed by me  Left forearm x-rays FINDINGS: A plate crosses an old radius fracture, affixed with multiple screws. Hardware is in good position. No evidence of hardware failure. Callus formation identified. No acute fracture or abnormality.  IMPRESSION: No cause for acute pain identified. ____________________________________________  FINAL ASSESSMENT AND PLAN  Left forearm pain, biceps strain  Plan: Patient's imaging was dictated above. Patient had presented for that arm pain. He had pain around his bicep muscle as well as along his left forearm for previous fracture and surgical site. He  notes to heavy lifting recently on his job. He has tendinitis in the bicep muscle or strained the muscle itself. Advise rest, ice and muscle relaxants. He is stable for discharge.   Emily Filbert, MD   Note: This note was generated in part or whole with voice recognition software. Voice recognition is usually quite accurate but there are transcription errors that can and very often do occur. I apologize for any typographical errors that were  not detected and corrected.     Emily Filbert, MD 09/02/16 1329

## 2016-09-02 NOTE — ED Triage Notes (Signed)
L forearm pain. Has repetitive use at work. Hx surgery same arm.

## 2017-01-08 ENCOUNTER — Emergency Department: Payer: No Typology Code available for payment source

## 2017-01-08 ENCOUNTER — Encounter: Payer: Self-pay | Admitting: Emergency Medicine

## 2017-01-08 DIAGNOSIS — S060X0A Concussion without loss of consciousness, initial encounter: Secondary | ICD-10-CM | POA: Diagnosis not present

## 2017-01-08 DIAGNOSIS — Y929 Unspecified place or not applicable: Secondary | ICD-10-CM | POA: Diagnosis not present

## 2017-01-08 DIAGNOSIS — Y939 Activity, unspecified: Secondary | ICD-10-CM | POA: Diagnosis not present

## 2017-01-08 DIAGNOSIS — Y998 Other external cause status: Secondary | ICD-10-CM | POA: Diagnosis not present

## 2017-01-08 DIAGNOSIS — T148XXA Other injury of unspecified body region, initial encounter: Secondary | ICD-10-CM | POA: Diagnosis not present

## 2017-01-08 DIAGNOSIS — F17200 Nicotine dependence, unspecified, uncomplicated: Secondary | ICD-10-CM | POA: Insufficient documentation

## 2017-01-08 DIAGNOSIS — S0990XA Unspecified injury of head, initial encounter: Secondary | ICD-10-CM | POA: Diagnosis present
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
## 2017-01-09 ENCOUNTER — Emergency Department
Admission: EM | Admit: 2017-01-09 | Discharge: 2017-01-09 | Disposition: A | Discharge status: Home / Self Care | Payer: No Typology Code available for payment source | Attending: Emergency Medicine | Admitting: Emergency Medicine

## 2017-01-09 DIAGNOSIS — S060X0A Concussion without loss of consciousness, initial encounter: Secondary | ICD-10-CM

## 2017-01-09 DIAGNOSIS — S0990XA Unspecified injury of head, initial encounter: Secondary | ICD-10-CM

## 2017-01-09 DIAGNOSIS — T07XXXA Unspecified multiple injuries, initial encounter: Secondary | ICD-10-CM

## 2017-01-09 MED ORDER — BUTALBITAL-APAP-CAFFEINE 50-325-40 MG PO TABS: 1.0000 | ORAL_TABLET | Freq: Four times a day (QID) | ORAL | 0 refills | Status: DC | PRN

## 2017-01-09 MED ORDER — BUTALBITAL-APAP-CAFFEINE 50-325-40 MG PO TABS
1.0000 | ORAL_TABLET | Freq: Once | ORAL | Status: AC
  Administered 2017-01-09: 1 via ORAL
  Filled 2017-01-09: qty 1

## 2017-01-09 MED ORDER — BACITRACIN ZINC 500 UNIT/GM EX OINT
TOPICAL_OINTMENT | Freq: Two times a day (BID) | CUTANEOUS | Status: DC
  Administered 2017-01-09: 1 via TOPICAL
  Filled 2017-01-09: qty 0.9

## 2017-01-09 NOTE — ED Notes (Signed)
ED Provider at bedside. 
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
## 2017-01-09 NOTE — Discharge Instructions (Signed)
Please follow up with the acute care clinic for further evaluation.  °

## 2017-11-17 ENCOUNTER — Emergency Department: Payer: Self-pay

## 2017-11-17 ENCOUNTER — Encounter: Payer: Self-pay | Admitting: Emergency Medicine

## 2017-11-17 ENCOUNTER — Emergency Department
Admission: EM | Admit: 2017-11-17 | Discharge: 2017-11-17 | Disposition: A | Discharge status: Home / Self Care | Payer: Self-pay | Attending: Emergency Medicine | Admitting: Emergency Medicine

## 2017-11-17 DIAGNOSIS — Y9389 Activity, other specified: Secondary | ICD-10-CM | POA: Insufficient documentation

## 2017-11-17 DIAGNOSIS — Y929 Unspecified place or not applicable: Secondary | ICD-10-CM | POA: Insufficient documentation

## 2017-11-17 DIAGNOSIS — X503XXA Overexertion from repetitive movements, initial encounter: Secondary | ICD-10-CM | POA: Insufficient documentation

## 2017-11-17 DIAGNOSIS — S39012A Strain of muscle, fascia and tendon of lower back, initial encounter: Secondary | ICD-10-CM | POA: Insufficient documentation

## 2017-11-17 DIAGNOSIS — F17200 Nicotine dependence, unspecified, uncomplicated: Secondary | ICD-10-CM | POA: Insufficient documentation

## 2017-11-17 DIAGNOSIS — Y998 Other external cause status: Secondary | ICD-10-CM | POA: Insufficient documentation

## 2017-11-17 MED ORDER — ORPHENADRINE CITRATE 30 MG/ML IJ SOLN
60.0000 mg | INTRAMUSCULAR | Status: AC
  Administered 2017-11-17: 60 mg via INTRAMUSCULAR
  Filled 2017-11-17: qty 2

## 2017-11-17 MED ORDER — KETOROLAC TROMETHAMINE 10 MG PO TABS: 10.0000 mg | ORAL_TABLET | Freq: Three times a day (TID) | ORAL | 0 refills | Status: DC

## 2017-11-17 MED ORDER — CYCLOBENZAPRINE HCL 10 MG PO TABS: ORAL_TABLET | ORAL | 0 refills | Status: DC

## 2017-11-17 MED ORDER — KETOROLAC TROMETHAMINE 30 MG/ML IJ SOLN
60.0000 mg | Freq: Once | INTRAMUSCULAR | Status: AC
  Administered 2017-11-17: 60 mg via INTRAMUSCULAR
  Filled 2017-11-17: qty 2

## 2017-11-17 NOTE — ED Triage Notes (Signed)
Patient with complaint of lower back pain times three weeks. Patient has tried OTC medications without relief.

## 2017-11-17 NOTE — Discharge Instructions (Signed)
Your exam and x-ray are essentially normal. You should take the prescription meds as directed. Apply ice and/or moist heat to reduce symptoms. Consider stretching and warming-up your back and legs before work. Follow-up with [facility] for ongoing symptoms.

## 2017-11-18 NOTE — ED Provider Notes (Signed)
[facility] Emergency Department Provider Note ____________________________________________  Time seen: 2115  I have reviewed the triage vital signs and the nursing notes.  HISTORY  Chief Complaint  Back Pain  HPI Dennis Atkins is a 22 y.o. male presents to the ED for evaluation of lower back pain, that has been persistent for the last 3 weeks. He works as a Corporate investment bankerconstruction worker, but denies any injury, accidents, trauma, or falls. He denies any history of sports injuries or persistent, chronic low back pain. He describes pain and tightness across the lower back. The pain and disability is worsening upon awakening and getting upright. He back loosens up at work, but tightens again during his 1-hour commute home. He denies any distal paresthesia, foot drop, saddle anesthesia, or incontinence. He has taken ibuprofen (400 mg) per dose, on schedule for the last several days without meaningful relief.   History reviewed. No pertinent past medical history.  There are no active problems to display for this patient.  Past Surgical History:  Procedure Laterality Date  . arm surgery    . FRACTURE SURGERY      Prior to Admission medications   Medication Sig Start Date End Date Taking? Authorizing Provider  cyclobenzaprine (FLEXERIL) 10 MG tablet Take QHS or up to TID prn spasms 11/17/17   Laqueshia Cihlar, Charlesetta IvoryJenise V Bacon, PA-C  ketorolac (TORADOL) 10 MG tablet Take 1 tablet (10 mg total) by mouth every 8 (eight) hours. 11/17/17   Anysa Tacey, Charlesetta IvoryJenise V Bacon, PA-C    Allergies Patient has no known allergies.  No family history on file.  Social History Social History   Tobacco Use  . Smoking status: Current Every Day Smoker    Packs/day: 0.50  . Smokeless tobacco: Never Used  Substance Use Topics  . Alcohol use: Yes    Comment: occ  . Drug use: Not Currently    Review of Systems  Constitutional: Negative for fever. Cardiovascular: Negative for chest pain. Respiratory:  Negative for shortness of breath. Gastrointestinal: Negative for abdominal pain, vomiting and diarrhea. Genitourinary: Negative for dysuria. Musculoskeletal: Positive for back pain. Skin: Negative for rash. Neurological: Negative for headaches, focal weakness or numbness. ____________________________________________  PHYSICAL EXAM:  VITAL SIGNS: ED Triage Vitals  Enc Vitals Group     BP 11/17/17 2005 (!) 142/73     Pulse Rate 11/17/17 2004 67     Resp 11/17/17 2004 18     Temp 11/17/17 2004 97.8 F (36.6 C)     Temp Source 11/17/17 2004 Oral     SpO2 11/17/17 2004 97 %     Weight 11/17/17 2005 240 lb (108.9 kg)     Height 11/17/17 2005 5\' 8"  (1.727 m)     Head Circumference --      Peak Flow --      Pain Score 11/17/17 2004 7     Pain Loc --      Pain Edu? --      Excl. in GC? --     Constitutional: Alert and oriented. Well appearing and in no distress. Head: Normocephalic and atraumatic. Eyes: Conjunctivae are normal. Normal extraocular movements Neck: Supple. Normal ROM. Cardiovascular: Normal rate, regular rhythm. Normal distal pulses. Respiratory: Normal respiratory effort. No wheezes/rales/rhonchi. Gastrointestinal: Soft and nontender. No distention. Musculoskeletal: Spinal alignment without midline tenderness, spasm, deformity, or step-off.  Patient transitions from sit to stand without assistance.  He is able to demonstrate normal lumbar flexion and extension range on exam.  Normal toe and heel raise  without difficulty.  Nontender with normal range of motion in all extremities.  Neurologic: Cranial nerves II through XII grossly intact.  Normal LE DTRs bilaterally.  Normal gait without ataxia. Normal speech and language. No gross focal neurologic deficits are appreciated. Skin:  Skin is warm, dry and intact. No rash noted. ____________________________________________   RADIOLOGY  Lumbar Spine  IMPRESSION: No evidence of fracture or subluxation along the lumbar  spine. ____________________________________________  PROCEDURES  Procedures Toradol 60 mg IM Norflex 60 mg IM ____________________________________________  INITIAL IMPRESSION / ASSESSMENT AND PLAN / ED COURSE  Patient with ED evaluation of persistent back pain for the last 3 weeks.  Patient's exam is overall benign.  X-rays reassuring at this time as it shows no acute fracture or DJD.  Patient's been likely represent lumbar strain and muscle overuse.  Patient will be discharged with prescriptions for cyclobenzaprine and ketorolac to dose as directed.  He is referred to [facility] clinic for ongoing symptom management.  He is also encouraged to consider stretching and warming up before his physically demanding work activities.  He will apply ice and heat to reduce symptoms.  Return precautions have been reviewed. ____________________________________________  FINAL CLINICAL IMPRESSION(S) / ED DIAGNOSES  Final diagnoses:  Strain of lumbar region, initial encounter      Lissa Hoard, PA-C 11/18/17 2324    Don Perking, [place], MD 11/23/17 [phone]

## 2018-03-02 ENCOUNTER — Emergency Department: Payer: Self-pay

## 2018-03-02 ENCOUNTER — Emergency Department
Admission: EM | Admit: 2018-03-02 | Discharge: 2018-03-03 | Disposition: A | Discharge status: Home / Self Care | Payer: Self-pay | Attending: Emergency Medicine | Admitting: Emergency Medicine

## 2018-03-02 DIAGNOSIS — I4891 Unspecified atrial fibrillation: Secondary | ICD-10-CM | POA: Insufficient documentation

## 2018-03-02 DIAGNOSIS — F17200 Nicotine dependence, unspecified, uncomplicated: Secondary | ICD-10-CM | POA: Insufficient documentation

## 2018-03-02 DIAGNOSIS — M79604 Pain in right leg: Secondary | ICD-10-CM | POA: Insufficient documentation

## 2018-03-02 DIAGNOSIS — Z79899 Other long term (current) drug therapy: Secondary | ICD-10-CM | POA: Insufficient documentation

## 2018-03-02 DIAGNOSIS — M79605 Pain in left leg: Secondary | ICD-10-CM | POA: Insufficient documentation

## 2018-03-02 DIAGNOSIS — Z7982 Long term (current) use of aspirin: Secondary | ICD-10-CM | POA: Insufficient documentation

## 2018-03-02 LAB — CBC
HCT: 45.8 % (ref 39.0–52.0)
HEMOGLOBIN: 15.5 g/dL (ref 13.0–17.0)
MCH: 28.7 pg (ref 26.0–34.0)
MCHC: 33.8 g/dL (ref 30.0–36.0)
MCV: 84.7 fL (ref 80.0–100.0)
Platelets: 253 10*3/uL (ref 150–400)
RBC: 5.41 MIL/uL (ref 4.22–5.81)
RDW: 12.7 % (ref 11.5–15.5)
WBC: 10.8 10*3/uL — ABNORMAL HIGH (ref 4.0–10.5)
nRBC: 0 % (ref 0.0–0.2)

## 2018-03-02 LAB — URINE DRUG SCREEN, QUALITATIVE (ARMC ONLY)
Amphetamines, Ur Screen: NOT DETECTED
BENZODIAZEPINE, UR SCRN: NOT DETECTED
Barbiturates, Ur Screen: NOT DETECTED
CANNABINOID 50 NG, UR ~~LOC~~: NOT DETECTED
COCAINE METABOLITE, UR ~~LOC~~: NOT DETECTED
MDMA (Ecstasy)Ur Screen: NOT DETECTED
METHADONE SCREEN, URINE: NOT DETECTED
Opiate, Ur Screen: NOT DETECTED
Phencyclidine (PCP) Ur S: NOT DETECTED
TRICYCLIC, UR SCREEN: NOT DETECTED

## 2018-03-02 LAB — BASIC METABOLIC PANEL
ANION GAP: 10 (ref 5–15)
BUN: 19 mg/dL (ref 6–20)
CALCIUM: 9 mg/dL (ref 8.9–10.3)
CHLORIDE: 106 mmol/L (ref 98–111)
CO2: 23 mmol/L (ref 22–32)
Creatinine, Ser: 0.76 mg/dL (ref 0.61–1.24)
GFR calc non Af Amer: >60 mL/min (ref >60)
GLUCOSE: 110 mg/dL — AB (ref 70–99)
Potassium: 3.8 mmol/L (ref 3.5–5.1)
Sodium: 139 mmol/L (ref 135–145)

## 2018-03-02 LAB — T4, FREE: FREE T4: 0.75 ng/dL — AB (ref 0.82–1.77)

## 2018-03-02 LAB — FIBRIN DERIVATIVES D-DIMER (ARMC ONLY): Fibrin derivatives D-dimer (ARMC): 190.1 ng/mL (FEU) (ref 0.00–499.00)

## 2018-03-02 LAB — TSH: TSH: 1.594 u[IU]/mL (ref 0.350–4.500)

## 2018-03-02 LAB — MAGNESIUM: Magnesium: 2.1 mg/dL (ref 1.7–2.4)

## 2018-03-02 LAB — TROPONIN I

## 2018-03-02 MED ORDER — MAGNESIUM SULFATE 2 GM/50ML IV SOLN
2.0000 g | Freq: Once | INTRAVENOUS | Status: DC
  Filled 2018-03-02: qty 50

## 2018-03-02 MED ORDER — DILTIAZEM HCL ER COATED BEADS 180 MG PO CP24
180.0000 mg | ORAL_CAPSULE | Freq: Once | ORAL | Status: AC
  Administered 2018-03-02: 180 mg via ORAL
  Filled 2018-03-02: qty 1

## 2018-03-02 MED ORDER — DILTIAZEM HCL ER COATED BEADS 180 MG PO CP24: 180.0000 mg | ORAL_CAPSULE | Freq: Every day | ORAL | 0 refills | Status: DC

## 2018-03-02 MED ORDER — SODIUM CHLORIDE 0.9 % IV BOLUS
1000.0000 mL | Freq: Once | INTRAVENOUS | Status: AC
  Administered 2018-03-02: 1000 mL via INTRAVENOUS

## 2018-03-02 MED ORDER — MAGNESIUM SULFATE 2 GM/50ML IV SOLN
INTRAVENOUS | Status: AC
  Administered 2018-03-02: 2 g
  Filled 2018-03-02: qty 50

## 2018-03-02 MED ORDER — DILTIAZEM HCL 25 MG/5ML IV SOLN
INTRAVENOUS | Status: AC
  Administered 2018-03-02: 40 mg via INTRAVENOUS
  Filled 2018-03-02: qty 5

## 2018-03-02 MED ORDER — ASPIRIN 81 MG PO TABS: 81.0000 mg | ORAL_TABLET | Freq: Every day | ORAL | 0 refills | Status: DC

## 2018-03-02 NOTE — ED Triage Notes (Addendum)
Pt reports 1 month of feeling lightheaded and "heart beating fast"; went out to eat tonight and when he got home, was watching tv, and the sensation returned; pt says his wife checked his pulse and said it felt irregular to her so they came for him to be evaluated; pt reports no chest pain today, but he did have pain when he was digging a ditch one day this week; EKG performed while typing note and pt's HR is 166; obtaining a bed from charge nurse

## 2018-03-02 NOTE — ED Provider Notes (Addendum)
[facility] Emergency Department Provider Note  ____________________________________________  Time seen: Approximately 9:15 PM  I have reviewed the triage vital signs and the nursing notes.   HISTORY  Chief Complaint Chest Pain and Palpitations   HPI Dennis Atkins is a 22 y.o. male no significant past medical history who presents for evaluation of chest pain and palpitations.  Patient reports this is his third episode over the last month where he feels his heart racing.  His wife checked his pulse at home this evening and noticed that it was fast and irregular which prompted a visit to the emergency room.  Patient reports that today for the first time he had mild chest pain associated with this episode which started after the palpitations.  In the past he felt dizzy like he was going to pass out usually the symptoms would resolve without any intervention.  He is a smoker, denies alcohol or drug use.  Has noticed intermittent mild SOB. Drinks 1 or 2 cups of caffeine a day.  No personal or family history of atrial fibrillation, PE or DVT, no recent travel immobilization, no hemoptysis, no exogenous hormones.  He describes his chest pain as sharp, feels like a pin is being pushed again his chest, intermittent in different locations of the chest.  The pain is mild.  Nonpleuritic in nature.  He does endorse intermittent bilateral lower extremity pain for the last few weeks.  No pain at this time.  Has not noticed any swelling.  PMH None - reviewed  Past Surgical History:  Procedure Laterality Date  . arm surgery    . FRACTURE SURGERY      Prior to Admission medications   Medication Sig Start Date End Date Taking? Authorizing Provider  aspirin 81 MG tablet Take 1 tablet (81 mg total) by mouth daily. 03/02/18   Nita SickleVeronese, Sharon, MD  diltiazem (CARDIZEM CD) 180 MG 24 hr capsule Take 1 capsule (180 mg total) by mouth daily. 03/02/18 03/02/19  Nita SickleVeronese, [place], MD      Allergies Patient has no known allergies.  FH No PE/ DVT No afib  Social History Social History   Tobacco Use  . Smoking status: Current Every Day Smoker    Packs/day: 0.50  . Smokeless tobacco: Never Used  Substance Use Topics  . Alcohol use: Yes    Comment: occ  . Drug use: Not Currently    Review of Systems  Constitutional: Negative for fever. Eyes: Negative for visual changes. ENT: Negative for sore throat. Neck: No neck pain  Cardiovascular: + chest pain., SOB Respiratory: + shortness of breath. Gastrointestinal: Negative for abdominal pain, vomiting or diarrhea. Genitourinary: Negative for dysuria. Musculoskeletal: Negative for back pain. + intermittent b/l leg pain Skin: Negative for rash. Neurological: Negative for headaches, weakness or numbness. Psych: No SI or HI  ____________________________________________   PHYSICAL EXAM:  VITAL SIGNS: Vitals:   03/02/18 2115  BP: 117/80  Pulse: 87  Resp: 17  Temp: 98 F (36.7 C)  SpO2: 96%   Constitutional: Alert and oriented. Well appearing and in no apparent distress. HEENT:      Head: Normocephalic and atraumatic.         Eyes: Conjunctivae are normal. Sclera is non-icteric.       Mouth/Throat: Mucous membranes are moist.       Neck: Supple with no signs of meningismus. Cardiovascular: Irregularly irregular rhythm with tachycardic rate. No murmurs, gallops, or rubs. 2+ symmetrical distal pulses are present in all extremities.  No JVD. Respiratory: Normal respiratory effort. Lungs are clear to auscultation bilaterally. No wheezes, crackles, or rhonchi.  Gastrointestinal: Soft, non tender, and non distended with positive bowel sounds. No rebound or guarding. Musculoskeletal: Nontender with normal range of motion in all extremities. No edema, cyanosis, or erythema of extremities. Neurologic: Normal speech and language. Face is symmetric. Moving all extremities. No gross focal neurologic deficits are  appreciated. Skin: Skin is warm, dry and intact. No rash noted. Psychiatric: Mood and affect are normal. Speech and behavior are normal.  ____________________________________________   LABS (all labs ordered are listed, but only abnormal results are displayed)  Labs Reviewed  CBC - Abnormal; Notable for the following components:      Result Value   WBC 10.8 (*)    All other components within normal limits  BASIC METABOLIC PANEL - Abnormal; Notable for the following components:   Glucose, Bld 110 (*)    All other components within normal limits  T4, FREE - Abnormal; Notable for the following components:   Free T4 0.75 (*)    All other components within normal limits  MAGNESIUM  TROPONIN I  FIBRIN DERIVATIVES D-DIMER ([facility] ONLY)  TSH  URINE DRUG SCREEN, QUALITATIVE ([facility] ONLY)   ____________________________________________  EKG  ED ECG REPORT I, Nita Sickle, the attending physician, personally viewed and interpreted this ECG.   20:51 -atrial fibrillation, ventricular rate of 162, normal QRS and QTC, normal axis, no ST elevations or depressions.  22:43 -atrial fibrillation with rate of 100, normal QTC, normal axis, no ST elevations or depressions. ____________________________________________  RADIOLOGY  I have personally reviewed the images performed during this visit and I agree with the Radiologist's read.   Interpretation by Radiologist:  Dg Chest 2 View  Result Date: 03/02/2018 CLINICAL DATA:  Palpitations, lightheaded. EXAM: CHEST - 2 VIEW COMPARISON:  None. FINDINGS: The heart size and mediastinal contours are within normal limits. Both lungs are clear. The visualized skeletal structures are unremarkable. IMPRESSION: No active cardiopulmonary disease. Electronically Signed   By: Bary Richard M.D.   On: 03/02/2018 22:04   US Venous Img Lower Bilateral  Result Date: 03/02/2018 CLINICAL DATA:  Intermittent pain, new tachycardia. EXAM: BILATERAL LOWER  EXTREMITY VENOUS DOPPLER ULTRASOUND TECHNIQUE: Gray-scale sonography with graded compression, as well as color Doppler and duplex ultrasound were performed to evaluate the lower extremity deep venous systems from the level of the common femoral vein and including the common femoral, femoral, profunda femoral, popliteal and calf veins including the posterior tibial, peroneal and gastrocnemius veins when visible. The superficial great saphenous vein was also interrogated. Spectral Doppler was utilized to evaluate flow at rest and with distal augmentation maneuvers in the common femoral, femoral and popliteal veins. COMPARISON:  None. FINDINGS: RIGHT LOWER EXTREMITY Common Femoral Vein: No evidence of thrombus. Normal compressibility, respiratory phasicity and response to augmentation. Saphenofemoral Junction: No evidence of thrombus. Normal compressibility and flow on color Doppler imaging. Profunda Femoral Vein: No evidence of thrombus. Normal compressibility and flow on color Doppler imaging. Femoral Vein: No evidence of thrombus. Normal compressibility, respiratory phasicity and response to augmentation. Popliteal Vein: No evidence of thrombus. Normal compressibility, respiratory phasicity and response to augmentation. Calf Veins: No evidence of thrombus. Normal compressibility and flow on color Doppler imaging. Superficial Great Saphenous Vein: No evidence of thrombus. Normal compressibility. Venous Reflux:  None. Other Findings:  None. LEFT LOWER EXTREMITY Common Femoral Vein: No evidence of thrombus. Normal compressibility, respiratory phasicity and response to augmentation. Saphenofemoral Junction: No evidence of  thrombus. Normal compressibility and flow on color Doppler imaging. Profunda Femoral Vein: No evidence of thrombus. Normal compressibility and flow on color Doppler imaging. Femoral Vein: No evidence of thrombus. Normal compressibility, respiratory phasicity and response to augmentation. Popliteal  Vein: No evidence of thrombus. Normal compressibility, respiratory phasicity and response to augmentation. Calf Veins: No evidence of thrombus. Normal compressibility and flow on color Doppler imaging. Superficial Great Saphenous Vein: No evidence of thrombus. Normal compressibility. Venous Reflux:  None. Other Findings:  None. IMPRESSION: No evidence of deep venous thrombosis, bilateral lower extremities. Electronically Signed   By: Bary Richard M.D.   On: 03/02/2018 22:56      ____________________________________________   PROCEDURES  Procedure(s) performed: None Procedures Critical Care performed:  None ____________________________________________   INITIAL IMPRESSION / ASSESSMENT AND PLAN / ED COURSE  22 y.o. male no significant past medical history who presents for evaluation of chest pain and palpitations.  Patient found to have new onset of atrial fibrillation with RVR.  He was given IV Cardizem 15mg  and repeat 25mg , IVF, and 2gm of IV magnesium with resolution of RVR. Will check labs to rule out anemia, electrolyte abnormalities, cardiac ischemia, blood clot, thyroid disorder, severe dehydration.  Will start patient on oral Cardizem. Will monitor on telemetry. ChadsVasc2 score of 0.  Clinical Course as of Mar 02 2312  Sat Mar 02, 2018  2305 Labs with no acute findings.  D-dimer negative.  Lower extremity Dopplers negative for DVT.  Patient is rates well controlled after IV Cardizem.  He was started on p.o. Cardizem.  No indication for anticoagulation.  Will put patient on aspirin.  Recommend close follow-up with cardiology for further evaluation and echocardiogram.  Discussed standard return precautions with patient.   [CV]  2311 I went to reassess patient again prior to DC and he was standing urinating on a urinal by the bedside. His HR had jumped to 120-140s. He has just received PO cardizem 10 min ago. His HR is controlled in the 90s - low 100s while sitting. Will recheck HR in 1  hour to allow PO cardizem to kick in. If persistent RVR plan to admit otherwise will dc home. Care transferred to Dr. Lamont Snowball   [CV]    Clinical Course User Index [CV] Don Perking [place], MD     As part of my medical decision making, I reviewed the following data within the electronic MEDICAL RECORD NUMBER History obtained from family, Nursing notes reviewed and incorporated, Labs reviewed , EKG interpreted , Radiograph reviewed , Notes from prior ED visits and [place] Controlled Substance Database    Pertinent labs & imaging results that were available during my care of the patient were reviewed by me and considered in my medical decision making (see chart for details).    ____________________________________________   FINAL CLINICAL IMPRESSION(S) / ED DIAGNOSES  Final diagnoses:  Atrial fibrillation with RVR (HCC)      NEW MEDICATIONS STARTED DURING THIS VISIT:  ED Discharge Orders         Ordered    diltiazem (CARDIZEM CD) 180 MG 24 hr capsule  Daily     03/02/18 2304    aspirin 81 MG tablet  Daily     03/02/18 2304           Note:  This document was prepared using Dragon voice recognition software and may include unintentional dictation errors.    Nita Sickle, MD 03/02/18 1610    Nita Sickle, MD 03/02/18 [phone]

## 2018-03-02 NOTE — ED Triage Notes (Signed)
Pt here pov , with palpitation / chest pain A/O x 4  SOB

## 2018-03-03 NOTE — ED Provider Notes (Signed)
HR back to high 90s.  Stable for dc.   Merrily Brittleifenbark, Kamora Vossler, MD 03/03/18 0002

## 2018-03-06 ENCOUNTER — Ambulatory Visit (INDEPENDENT_AMBULATORY_CARE_PROVIDER_SITE_OTHER): Payer: Self-pay | Admitting: Cardiovascular Disease

## 2018-03-06 ENCOUNTER — Encounter: Payer: Self-pay | Admitting: Cardiovascular Disease

## 2018-03-06 VITALS — BP 120/80 | HR 78 | Ht 66.0 in | Wt 248.5 lb

## 2018-03-06 DIAGNOSIS — I480 Paroxysmal atrial fibrillation: Secondary | ICD-10-CM

## 2018-03-06 DIAGNOSIS — F17200 Nicotine dependence, unspecified, uncomplicated: Secondary | ICD-10-CM | POA: Insufficient documentation

## 2018-03-06 MED ORDER — METOPROLOL TARTRATE 25 MG PO TABS: 25.0000 mg | ORAL_TABLET | Freq: Two times a day (BID) | ORAL | 6 refills | Status: DC | PRN

## 2018-03-06 MED ORDER — DILTIAZEM HCL ER COATED BEADS 180 MG PO CP24: 180.0000 mg | ORAL_CAPSULE | Freq: Every day | ORAL | 3 refills | Status: DC

## 2018-03-06 NOTE — Progress Notes (Signed)
Cardiology Office Note  Date:  03/06/2018   ID:  Fayne MediateJessie Rizzi, DOB 09-29-1995, MRN [id number]  PCP:  Patient, No Pcp Per   Chief Complaint  Patient presents with  . other    [facility]; elvevated heart rate & A-Fib. Meds reviewed by the pt. verbally. Pt. c/o shortness of breath and rapid heart beats today; hasn't taken his Diltiazem today.     HPI:  Mr. Fayne MediateJessie Paul is a 22 year old gentleman with past medical history of Smoker Paroxysmal atrial fibrillation Frequent trips to the emergency room for various reasons 2017, 18, 19 Who presents by referral from the emergency room for consultation of his atrial fibrillation  Reports several episodes of tachycardia, at least 3 in the past month Typically will come on at home rest, no provocation Denies any energy drinks, other drugs  Most recent episode was sitting on the couch, watching movies, developed acute tachycardia After 1 hour or so continue to have discomfort, shortness of breath, dizziness and went to the emergency room  emergency room March 02, 2018 EKG show atrial fibrillation ventricular rate 160 bpm Some chest pain and palpitations Felt dizzy like he was going to pass out Started on diltiazem extended release 180 mg daily Slept that night and in the morning woke up with normal rhythm  Lab work reviewed Free T4 0.75  Reports he is tolerating diltiazem extended release 180 Back to work with no complaints At baseline no significant shortness of breath or chest pain No leg edema, de home Nies apnea  EKG personally reviewed by myself on todays visit Shows normal sinus rhythm with rate 78 bpm no significant ST or T wave changes    PMH:   Paroxysmal atrial fibrillation Smoker  PSH:    Past Surgical History:  Procedure Laterality Date  . arm surgery    . FRACTURE SURGERY      Current Outpatient Medications  Medication Sig Dispense Refill  . aspirin 81 MG tablet Take 1 tablet (81  mg total) by mouth daily. 30 tablet 0  . diltiazem (CARDIZEM CD) 180 MG 24 hr capsule Take 1 capsule (180 mg total) by mouth daily. 90 capsule 3  . metoprolol tartrate (LOPRESSOR) 25 MG tablet Take 1 tablet (25 mg total) by mouth 2 (two) times daily as needed (for atrial fibrillation episodes). 60 tablet 6   No current facility-administered medications for this visit.      Allergies:   Patient has no known allergies.   Social History:  The patient  reports that he has been smoking cigarettes. He has been smoking about 0.50 packs per day. He has never used smokeless tobacco. He reports that he drinks alcohol. He reports that he has current or past drug history.   Family History:   family history is not on file.    Review of Systems: Review of Systems  Constitutional: Negative.   Respiratory: Negative.   Cardiovascular: Positive for palpitations.       Tachycardia  Gastrointestinal: Negative.   Musculoskeletal: Negative.   Neurological: Negative.   Psychiatric/Behavioral: Negative.   All other systems reviewed and are negative.    PHYSICAL EXAM: VS:  BP 120/80 (BP Location: Left Arm, Patient Position: Sitting, Cuff Size: Normal)   Pulse 78   Ht 5\' 6"  (1.676 m)   Wt 248 lb 8 oz (112.7 kg)   BMI 40.11 kg/m  , BMI Body mass index is 40.11 kg/m. GEN: Well nourished, well developed, in no acute  distress  HEENT: normal  Neck: no JVD, carotid bruits, or masses Cardiac: RRR; no murmurs, rubs, or gallops,no edema  Respiratory:  clear to auscultation bilaterally, normal work of breathing GI: soft, nontender, nondistended, + BS MS: no deformity or atrophy  Skin: warm and dry, no rash Neuro:  Strength and sensation are intact Psych: euthymic mood, full affect   Recent Labs: 03/02/2018: BUN 19; Creatinine, Ser 0.76; Hemoglobin 15.5; Magnesium 2.1; Platelets 253; Potassium 3.8; Sodium 139; TSH 1.594    Lipid Panel No results found for: CHOL, HDL, LDLCALC, TRIG    Wt Readings  from Last 3 Encounters:  03/06/18 248 lb 8 oz (112.7 kg)  03/02/18 240 lb (108.9 kg)  11/17/17 240 lb (108.9 kg)       ASSESSMENT AND PLAN:  Paroxysmal atrial fibrillation (HCC) - Denies sleep apnea Suggested he stay on diltiazem extended release 180 mg daily Low CHADS VASC. Does not need aspirin or anticoagulation We have given him metoprolol tartrate 25 mg to take for breakthrough arrhythmia He will call us for breakthrough tachycardia If symptoms persist may need sleep study  Smoker We have encouraged him to continue to work on weaning his cigarettes and smoking cessation. He will continue to work on this and does not want any assistance with chantix.    Total encounter time more than 45 minutes  Greater than 50% was spent in counseling and coordination of care with the patient    Orders Placed This Encounter  Procedures  . EKG 12-Lead  . ECHOCARDIOGRAM COMPLETE     Signed, Dossie Arbour, M.D., Ph.D. 03/06/2018  [facility] [place], [place] [phone]

## 2018-03-06 NOTE — Patient Instructions (Addendum)
Medication Instructions:   Stay on diltiazem 180 mg daily  If you have acute tachycardia, atrial fib, Take an emergency pill metoprolol 1-2 pills as needed  If you need a refill on your cardiac medications before your next appointment, please call your pharmacy.    Lab work: No new labs needed   If you have labs (blood work) drawn today and your tests are completely normal, you will receive your results only by: Marland Kitchen. MyChart Message (if you have MyChart) OR . A paper copy in the mail If you have any lab test that is abnormal or we need to change your treatment, we will call you to review the results.   Testing/Procedures: We will order an echo for atrial fibrillation   Follow-Up: At [facility], you and your health needs are our priority.  As part of our continuing mission to provide you with exceptional heart care, we have created designated Provider Care Teams.  These Care Teams include your primary Cardiologist (physician) and Advanced Practice Providers (APPs -  Physician Assistants and Nurse Practitioners) who all work together to provide you with the care you need, when you need it.  . You will need a follow up appointment in 12 months .   Please call our office 2 months in advance to schedule this appointment.    . Providers on your designated Care Team:   . Nicolasa Duckinghristopher Berge, NP . Eula Listenyan Dunn, PA-C . Marisue IvanJacquelyn Visser, PA-C  Any Other Special Instructions Will Be Listed Below (If Applicable).  For educational health videos Log in to : [email or web address] Or : [email or web address], password : [email or web address]

## 2018-04-19 ENCOUNTER — Other Ambulatory Visit: Payer: Self-pay

## 2018-05-07 ENCOUNTER — Other Ambulatory Visit: Payer: Self-pay

## 2018-05-07 DIAGNOSIS — R0989 Other specified symptoms and signs involving the circulatory and respiratory systems: Secondary | ICD-10-CM

## 2018-10-03 ENCOUNTER — Telehealth: Payer: Self-pay | Admitting: Cardiovascular Disease

## 2018-10-03 NOTE — Telephone Encounter (Signed)

## 2018-10-04 ENCOUNTER — Other Ambulatory Visit: Payer: Self-pay
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
## 2020-05-20 ENCOUNTER — Telehealth: Payer: Self-pay | Admitting: Cardiovascular Disease

## 2020-05-20 NOTE — Telephone Encounter (Signed)
3 attempts to schedule fu appt from recall list.   Deleting recall.   

## 2020-12-07 ENCOUNTER — Encounter: Payer: Self-pay | Admitting: Cardiovascular Disease

## 2020-12-07 ENCOUNTER — Ambulatory Visit (INDEPENDENT_AMBULATORY_CARE_PROVIDER_SITE_OTHER): Payer: Self-pay | Admitting: Cardiovascular Disease

## 2020-12-07 ENCOUNTER — Telehealth: Payer: Self-pay | Admitting: Cardiovascular Disease

## 2020-12-07 ENCOUNTER — Other Ambulatory Visit: Payer: Self-pay

## 2020-12-07 VITALS — BP 120/80 | HR 81 | Ht 68.5 in | Wt 282.4 lb

## 2020-12-07 DIAGNOSIS — I480 Paroxysmal atrial fibrillation: Secondary | ICD-10-CM

## 2020-12-07 MED ORDER — METOPROLOL TARTRATE 25 MG PO TABS: 25.0000 mg | ORAL_TABLET | Freq: Two times a day (BID) | ORAL | 3 refills | Status: AC

## 2020-12-07 MED ORDER — DILTIAZEM HCL ER COATED BEADS 180 MG PO CP24: 180.0000 mg | ORAL_CAPSULE | Freq: Every day | ORAL | 3 refills | Status: DC

## 2020-12-07 NOTE — Telephone Encounter (Signed)
Incoming triage call received. Patient sts that he reported to work this morning but left early due to feeling poorly. Patient reports HRs in the 160s--180s. Pounding in the chest and headache. Symptoms resolve when he sits and relaxes. Patient does has a hx of Afib with rvr. Pt sts that he has these episodes every 3-4 months.  He was last seen by Dr. Mariah Milling in 2019. He has Diltiazem 180 mg on hand, he did take 2 this morning with no relief. Adv the patient that the medication may be outdated and has lost its potency. He is currently at home and feels ok. He has been scheduled by scheduling to see Dr. Mariah Milling today @ 3:20 pm. Pt is currently asymptomatic. Advised him to sit and relax today. Advised the patient to keep the appt today with Dr. Mariah Milling , however is symptoms return or worsen in the interim he should seek care in the ED.  Patient verbalized understanding.

## 2020-12-07 NOTE — Telephone Encounter (Signed)
STAT if HR is under 50 or over 120 (normal HR is 60-100 beats per minute)  What is your heart rate?163  Do you have a log of your heart rate readings (document readings)? Earlier this morning was 187  Do you have any other symptoms? Heavy heartbeat in chest, headache. When he sits down it goes away.   States he took Cardizem earlier this morning and has not helped. He states that this medication has never helped him .

## 2020-12-07 NOTE — Patient Instructions (Addendum)
Referral to Dr. Lalla Brothers our cardiac EP (electrophysiology) for evaluation of A-fib  Medication Instructions:  Please restart  diltiazem ER 180 once a day Take in evening Also take metoprolol tartrate 25 mg twice a day  For breakthrough atrial fibrillation, Take metoprolol tartrate 2 pills of the 25 mg (50 mg)  If you need a refill on your cardiac medications before your next appointment, please call your pharmacy.   Lab work: No new labs needed  Testing/Procedures: No new testing needed  Follow-Up: At [facility], you and your health needs are our priority.  As part of our continuing mission to provide you with exceptional heart care, we have created designated Provider Care Teams.  These Care Teams include your primary Cardiologist (physician) and Advanced Practice Providers (APPs -  Physician Assistants and Nurse Practitioners) who all work together to provide you with the care you need, when you need it.  You will need a follow up appointment in 12 months  Providers on your designated Care Team:   Nicolasa Ducking, NP Eula Listen, PA-C Marisue Ivan, PA-C [place], [place]  COVID-19 Vaccine Information can be found at: [email or web address] For questions related to vaccine distribution or appointments, please email [email or web address] or call 7174563498.

## 2020-12-07 NOTE — Progress Notes (Signed)
Cardiology Office Note  Date:  12/07/2020   ID:  Dennis Atkins, DOB [birth year], MRN [id number]  PCP:  Patient, No Pcp Per (Inactive)   Chief Complaint  Patient presents with   12 month follow up     A-Fib. Medications reviewed by the patient verbally.     HPI:  Mr. Kalub Morillo is a 25 year old gentleman with past medical history of Smoker Paroxysmal atrial fibrillation first documented in the emergency room 2019 Who presents for f/u of his paroxysmal atrial fibrillation  Last seen by myself November 2019 Reports he has run out of his diltiazem, has not been taking this on a regular basis Even on the diltiazem, reports having frequent episodes of atrial fibrillation with rates up to 130 bpm  Typically will develop atrial fibrillation in the morning Denies having sleep apnea  This morning developed tachypalpitations rate went up to 180 bpm, consistent with his atrial fibrillation Reports that he had to leave work, went home, Took metoprolol tartrate 25 mg x 2, took a nap, when he woke up was back to normal sinus rhythm  On prior clinic visit reported having at least 3 episodes per month Typically will come on at home rest, no provocation Denies any energy drinks, other drugs No triggers  EKG personally reviewed by myself on todays visit Normal sinus rhythm rate 81 bpm no significant ST-T wave changes  Other past medical history reviewed  emergency room March 02, 2018 EKG show atrial fibrillation ventricular rate 160 bpm Some chest pain and palpitations Felt dizzy like he was going to pass out Started on diltiazem extended release 180 mg daily Slept that night and in the morning woke up with normal rhythm  Lab work reviewed Free T4 0.75  PMH:   Paroxysmal atrial fibrillation Smoker  PSH:    Past Surgical History:  Procedure Laterality Date   arm surgery     FRACTURE SURGERY      Current Outpatient Medications  Medication Sig Dispense Refill   diltiazem  (CARDIZEM CD) 180 MG 24 hr capsule Take 1 capsule (180 mg total) by mouth daily. (Patient not taking: Reported on 12/07/2020) 90 capsule 3   metoprolol tartrate (LOPRESSOR) 25 MG tablet Take 1 tablet (25 mg total) by mouth 2 (two) times daily as needed (for atrial fibrillation episodes). (Patient not taking: Reported on 12/07/2020) 60 tablet 6   No current facility-administered medications for this visit.     Allergies:   Patient has no known allergies.   Social History:  The patient  reports that he has been smoking cigarettes. He has been smoking an average of .5 packs per day. He has never used smokeless tobacco. He reports current alcohol use. He reports that he does not currently use drugs.   Family History:   family history is not on file.   Review of Systems: Review of Systems  Constitutional: Negative.   Respiratory: Negative.    Cardiovascular:  Positive for palpitations.       Tachycardia  Gastrointestinal: Negative.   Musculoskeletal: Negative.   Neurological: Negative.   Psychiatric/Behavioral: Negative.    All other systems reviewed and are negative.   PHYSICAL EXAM: VS:  BP 120/80 (BP Location: Left Arm, Patient Position: Sitting, Cuff Size: Large)   Pulse 81   Ht 5' 8.5" (1.74 m)   Wt 282 lb 6 oz (128.1 kg)   SpO2 98%   BMI 42.31 kg/m  , BMI Body mass index is 42.31 kg/m. GEN: Well nourished, well developed,  in no acute distress  HEENT: normal  Neck: no JVD, carotid bruits, or masses Cardiac: RRR; no murmurs, rubs, or gallops,no edema  Respiratory:  clear to auscultation bilaterally, normal work of breathing GI: soft, nontender, nondistended, + BS MS: no deformity or atrophy  Skin: warm and dry, no rash Neuro:  Strength and sensation are intact Psych: euthymic mood, full affect   Recent Labs: No results found for requested labs within last 8760 hours.    Lipid Panel No results found for: CHOL, HDL, LDLCALC, TRIG    Wt Readings from Last 3  Encounters:  12/07/20 282 lb 6 oz (128.1 kg)  03/06/18 248 lb 8 oz (112.7 kg)  03/02/18 240 lb (108.9 kg)     ASSESSMENT AND PLAN:  Paroxysmal atrial fibrillation (HCC) - Denies sleep apnea Suggested he stay on diltiazem extended release 180 mg daily Also start metoprolol tartrate 25 twice daily Low CHADS VASC. For breakthrough atrial fibrillation recommend he take 25 up to 50 mg extra metoprolol -We have made referral to EP for consideration of atrial fibrillation given his young age  Smoker We have encouraged him to continue to work on weaning his cigarettes and smoking cessation. He will continue to work on this and does not want any assistance with chantix.     Total encounter time more than 25 minutes  Greater than 50% was spent in counseling and coordination of care with the patient    No orders of the defined types were placed in this encounter.    Signed, Dossie Arbour, M.D., Ph.D. 12/07/2020  [facility] [place], [place] [phone]

## 2021-01-04 ENCOUNTER — Other Ambulatory Visit: Payer: Self-pay

## 2021-01-04 ENCOUNTER — Encounter: Payer: Self-pay | Admitting: Emergency Medicine

## 2021-01-04 ENCOUNTER — Ambulatory Visit
Admission: EM | Admit: 2021-01-04 | Discharge: 2021-01-04 | Disposition: A | Discharge status: Home / Self Care | Payer: BC Managed Care – PPO | Attending: Emergency Medicine | Admitting: Emergency Medicine

## 2021-01-04 DIAGNOSIS — J209 Acute bronchitis, unspecified: Secondary | ICD-10-CM

## 2021-01-04 DIAGNOSIS — J069 Acute upper respiratory infection, unspecified: Secondary | ICD-10-CM

## 2021-01-04 DIAGNOSIS — R062 Wheezing: Secondary | ICD-10-CM

## 2021-01-04 HISTORY — DX: Tachycardia, unspecified: R00.0

## 2021-01-04 MED ORDER — ALBUTEROL SULFATE HFA 108 (90 BASE) MCG/ACT IN AERS: 1.0000 | INHALATION_SPRAY | RESPIRATORY_TRACT | 0 refills | Status: AC | PRN

## 2021-01-04 MED ORDER — SPACER/AERO-HOLDING CHAMBERS DEVI: 0 refills | Status: AC

## 2021-01-04 NOTE — ED Triage Notes (Signed)
Saturday started having a sorethroat.  Then went away.  Yesterday runny nose, stuffy nose, stuffy ears.  Patient has been taking care of a child with uri symptoms.  Both have had negative home covid test

## 2021-01-04 NOTE — Discharge Instructions (Signed)
Use the albuterol inhaler with the spacer every 4-6 hours shortness of breath or wheezing.  It is okay to use over-the-counter cold medicine to try to help relieve your symptoms but do not take more than is recommended on the package and avoid medications that have decongestants in them.  I suggest trying plain Mucinex (generic guaifenesin is okay to use) use saline nasal spray several times a day to help with your congestion thins out and drains.

## 2021-01-07 NOTE — ED Provider Notes (Signed)
[facility]    CSN: [id number] Arrival date & time: 01/04/21  1748      History   Chief Complaint Chief Complaint  Patient presents with   Otalgia    HPI Dennis Atkins is a 25 y.o. male. He took care of his sick child last week (who is also being seen today for illness) but did not start to feel badly until 01/01/21 at which time he developed a sore throat and cough, nasal congestion. Sore throat now better, still with congestion and cough. Does not have hx asthma. Child and pt with negative home covid tests. Pt reports he quit smoking awhile ago.    Otalgia Associated symptoms: congestion, cough, rhinorrhea and sore throat   Associated symptoms: no fever    Past Medical History:  Diagnosis Date   Tachycardia     Patient Active Problem List   Diagnosis Date Noted   Paroxysmal atrial fibrillation (HCC) 03/06/2018   Smoker 03/06/2018    Past Surgical History:  Procedure Laterality Date   arm surgery     FRACTURE SURGERY         Home Medications    Prior to Admission medications   Medication Sig Start Date End Date Taking? Authorizing Provider  albuterol (VENTOLIN HFA) 108 (90 Base) MCG/ACT inhaler Inhale 1-2 puffs into the lungs every 4 (four) hours as needed for wheezing or shortness of breath. 01/04/21  Yes Cathlyn Parsons, NP  Spacer/Aero-Holding Rudean Curt Use with albuterol inhaler 01/04/21  Yes Cathlyn Parsons, NP  diltiazem (CARDIZEM CD) 180 MG 24 hr capsule Take 1 capsule (180 mg total) by mouth daily. 12/07/20   Antonieta Iba, MD  metoprolol tartrate (LOPRESSOR) 25 MG tablet Take 1 tablet (25 mg total) by mouth 2 (two) times daily. 12/07/20   Antonieta Iba, MD    Family History Family History  Problem Relation Age of Onset   Healthy Mother    Healthy Father     Social History Social History   Tobacco Use   Smoking status: Some Days    Packs/day: 0.50    Types: Cigarettes   Smokeless tobacco: Never  Vaping Use   Vaping Use:  Never used  Substance Use Topics   Alcohol use: Yes    Comment: occ   Drug use: Not Currently     Allergies   Patient has no known allergies.   Review of Systems Review of Systems  Constitutional:  Negative for chills and fever.  HENT:  Positive for congestion, postnasal drip, rhinorrhea and sore throat. Negative for ear pain, sinus pressure and sinus pain.   Respiratory:  Positive for cough.     Physical Exam Triage Vital Signs ED Triage Vitals  Enc Vitals Group     BP 01/04/21 1914 125/78     Pulse Rate 01/04/21 1914 (!) 106     Resp 01/04/21 1914 18     Temp 01/04/21 1914 98 F (36.7 C)     Temp Source 01/04/21 1914 Oral     SpO2 01/04/21 1914 98 %     Weight --      Height --      Head Circumference --      Peak Flow --      Pain Score 01/04/21 1909 0     Pain Loc --      Pain Edu? --      Excl. in GC? --    No data found.  Updated Vital Signs BP  125/78 (BP Location: Left Arm)   Pulse (!) 106   Temp 98 F (36.7 C) (Oral)   Resp 18   SpO2 98%   Visual Acuity Right Eye Distance:   Left Eye Distance:   Bilateral Distance:    Right Eye Near:   Left Eye Near:    Bilateral Near:     Physical Exam Constitutional:      Appearance: Normal appearance. He is ill-appearing. He is not toxic-appearing.  HENT:     Right Ear: Tympanic membrane, ear canal and external ear normal.     Left Ear: Tympanic membrane, ear canal and external ear normal.     Nose: Congestion present.     Mouth/Throat:     Mouth: Mucous membranes are moist.     Pharynx: Oropharynx is clear.  Cardiovascular:     Rate and Rhythm: Normal rate and regular rhythm.  Pulmonary:     Effort: Pulmonary effort is normal.     Breath sounds: Wheezing present.     Comments: Diffuse mild wheezing Lymphadenopathy:     Cervical: No cervical adenopathy.  Neurological:     Mental Status: He is alert.     UC Treatments / Results  Labs (all labs ordered are listed, but only abnormal results  are displayed) Labs Reviewed - No data to display  EKG   Radiology No results found.  Procedures Procedures (including critical care time)  Medications Ordered in UC Medications - No data to display  Initial Impression / Assessment and Plan / UC Course  I have reviewed the triage vital signs and the nursing notes.  Pertinent labs & imaging results that were available during my care of the patient were reviewed by me and considered in my medical decision making (see chart for details).  URI and wheezing in pt without hx of asthma. Rx albuterol with spacer. Reviewed supportive care measures.     Final Clinical Impressions(s) / UC Diagnoses   Final diagnoses:  Viral URI with cough  Acute bronchitis, unspecified organism  Wheezing     Discharge Instructions      Use the albuterol inhaler with the spacer every 4-6 hours shortness of breath or wheezing.  It is okay to use over-the-counter cold medicine to try to help relieve your symptoms but do not take more than is recommended on the package and avoid medications that have decongestants in them.  I suggest trying plain Mucinex (generic guaifenesin is okay to use) use saline nasal spray several times a day to help with your congestion thins out and drains.   ED Prescriptions     Medication Sig Dispense Auth. Provider   albuterol (VENTOLIN HFA) 108 (90 Base) MCG/ACT inhaler Inhale 1-2 puffs into the lungs every 4 (four) hours as needed for wheezing or shortness of breath. 1 each Cathlyn Parsons, NP   Spacer/Aero-Holding Rudean Curt Use with albuterol inhaler 1 each Cathlyn Parsons, NP      PDMP not reviewed this encounter.   Cathlyn Parsons, NP 01/07/21 1331

## 2021-01-11 NOTE — Progress Notes (Signed)
Electrophysiology Office Note:    Date:  01/12/2021   ID:  Dennis Atkins, DOB [birth year], MRN [id number]  PCP:  Patient, No Pcp Per (Inactive)  [facility] Cardiologist:  None  [facility] Electrophysiologist:  Lanier Prude, MD   Referring MD: Antonieta Iba, MD   Chief Complaint: AF  History of Present Illness:    Dennis Atkins is a 25 y.o. male who presents for an evaluation of AF at the request of Dr Mariah Milling. Their medical history includes obesity.  The patient last saw Dr Mariah Milling 12/07/2020 for AF. Dx of AF goes back to 2019. On dilt in the past. Despite diltiazem he continues to have AF episodes.  Today he tells me the episodes do not occur that frequently.  Since his visit with Dr. Mariah Milling he has not had an episode.  He tells me in the past his episodes have always occurred in the morning.  He tells me that he does snore at night and has been told in the past that he has an abnormal sleep breathing pattern.  He has never been tested for sleep apnea.  No family history of atrial fibrillation to his knowledge.  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  Past Medical History:  Diagnosis Date   Tachycardia     Past Surgical History:  Procedure Laterality Date   arm surgery     FRACTURE SURGERY      Current Medications: Current Meds  Medication Sig   albuterol (VENTOLIN HFA) 108 (90 Base) MCG/ACT inhaler Inhale 1-2 puffs into the lungs every 4 (four) hours as needed for wheezing or shortness of breath.   diltiazem (CARDIZEM CD) 180 MG 24 hr capsule Take 1 capsule (180 mg total) by mouth daily.   metoprolol tartrate (LOPRESSOR) 25 MG tablet Take 1 tablet (25 mg total) by mouth 2 (two) times daily.   Spacer/Aero-Holding Rudean Curt Use with albuterol inhaler     Allergies:   Patient has no known allergies.   Social History   Socioeconomic History   Marital status: Single    Spouse name: Not on file   Number of  children: Not on file   Years of education: Not on file   Highest education level: Not on file  Occupational History   Not on file  Tobacco Use   Smoking status: Some Days    Packs/day: 0.50    Types: Cigarettes   Smokeless tobacco: Never  Vaping Use   Vaping Use: Never used  Substance and Sexual Activity   Alcohol use: Yes    Comment: occ   Drug use: Not Currently   Sexual activity: Not on file  Other Topics Concern   Not on file  Social History Narrative   Not on file   Social Determinants of Health   Financial Resource Strain: Not on file  Food Insecurity: Not on file  Transportation Needs: Not on file  Physical Activity: Not on file  Stress: Not on file  Social Connections: Not on file     Family History: The patient's family history includes Healthy in his father and mother.  ROS:   Please see the history of present illness.    All other systems reviewed and are negative.  EKGs/Labs/Other Studies Reviewed:    The following studies were reviewed today:   EKG:  The ekg ordered today demonstrates sinus rhythm.  No preexcitation.  Normal intervals.  Recent Labs: No results  found for requested labs within last 8760 hours.  Recent Lipid Panel No results found for: CHOL, TRIG, HDL, CHOLHDL, VLDL, LDLCALC, LDLDIRECT  Physical Exam:    VS:  BP 118/68   Pulse 77   Ht 5\' 8"  (1.727 m)   Wt 292 lb 9.6 oz (132.7 kg)   SpO2 98%   BMI 44.49 kg/m     Wt Readings from Last 3 Encounters:  01/12/21 292 lb 9.6 oz (132.7 kg)  12/07/20 282 lb 6 oz (128.1 kg)  03/06/18 248 lb 8 oz (112.7 kg)     GEN:  Well nourished, well developed in no acute distress.  Obese HEENT: Normal NECK: No JVD; No carotid bruits LYMPHATICS: No lymphadenopathy CARDIAC: RRR, no murmurs, rubs, gallops RESPIRATORY:  Clear to auscultation without rales, wheezing or rhonchi  ABDOMEN: Soft, non-tender, non-distended MUSCULOSKELETAL:  No edema; No deformity  SKIN: Warm and dry NEUROLOGIC:   Alert and oriented x 3 PSYCHIATRIC:  Normal affect   ASSESSMENT:    1. Paroxysmal atrial fibrillation (HCC)   2. Essential hypertension    PLAN:    In order of problems listed above:  #Paroxysmal atrial fibrillation Symptomatic.  Fairly low burden.  On metoprolol and diltiazem.  Given history of episodes of atrial fibrillation occurring in the morning after awakening and his history of snoring abnormal breathing patterns at night, I suspect he has sleep apnea which may be driving his atrial fibrillation.  I like to get him set up for a sleep study as an outpatient.  We discussed other treatment options available for his atrial fibrillation including antiarrhythmic drug therapy and ablation.  For now, given the fairly low burden of atrial fibrillation, would favor conservative approach.  He will let 03/08/18 know if his burden of atrial fibrillation increases.  If that were to occur, would favor an antiarrhythmic drug.  In the meantime, he will work on weight loss.  We did discuss how his weight is currently a contraindication to ablation.  If he were able to lose some weight, it would open up ablation as a future treatment option for his atrial fibrillation.  #Hypertension Controlled.  Continue diltiazem and metoprolol.  Weight loss encouraged.  #Obesity Discussed the link between obesity and atrial fibrillation during today's appointment.   Follow-up 3 months.    Medication Adjustments/Labs and Tests Ordered: Current medicines are reviewed at length with the patient today.  Concerns regarding medicines are outlined above.  Orders Placed This Encounter  Procedures   EKG 12-Lead   [place] Sleep Study   No orders of the defined types were placed in this encounter.    Signed, [place]. Rossie Muskrat, MD, [facility], [facility] 01/12/2021 8:49 PM    Electrophysiology [facility]

## 2021-01-12 ENCOUNTER — Other Ambulatory Visit: Payer: Self-pay

## 2021-01-12 ENCOUNTER — Ambulatory Visit (INDEPENDENT_AMBULATORY_CARE_PROVIDER_SITE_OTHER): Payer: BC Managed Care – PPO | Admitting: Cardiology

## 2021-01-12 ENCOUNTER — Encounter: Payer: Self-pay | Admitting: Cardiology

## 2021-01-12 VITALS — BP 118/68 | HR 77 | Ht 68.0 in | Wt 292.6 lb

## 2021-01-12 DIAGNOSIS — I480 Paroxysmal atrial fibrillation: Secondary | ICD-10-CM | POA: Diagnosis not present

## 2021-01-12 DIAGNOSIS — I10 Essential (primary) hypertension: Secondary | ICD-10-CM | POA: Diagnosis not present

## 2021-01-12 NOTE — Patient Instructions (Signed)
Medication Instructions:  Your physician recommends that you continue on your current medications as directed. Please refer to the Current Medication list given to you today. *If you need a refill on your cardiac medications before your next appointment, please call your pharmacy*  Lab Work: None ordered. If you have labs (blood work) drawn today and your tests are completely normal, you will receive your results only by: MyChart Message (if you have MyChart) OR A paper copy in the mail If you have any lab test that is abnormal or we need to change your treatment, we will call you to review the results.  Testing/Procedures: Your physician has recommended that you have a sleep study. This test records several body functions during sleep, including: brain activity, eye movement, oxygen and carbon dioxide blood levels, heart rate and rhythm, breathing rate and rhythm, the flow of air through your mouth and nose, snoring, body muscle movements, and chest and belly movement.  Follow-Up: At [facility], you and your health needs are our priority.  As part of our continuing mission to provide you with exceptional heart care, we have created designated Provider Care Teams.  These Care Teams include your primary Cardiologist (physician) and Advanced Practice Providers (APPs -  Physician Assistants and Nurse Practitioners) who all work together to provide you with the care you need, when you need it.  Your next appointment:   Your physician wants you to follow-up in: 3 months with Dennis Atkins

## 2021-01-17 ENCOUNTER — Telehealth: Payer: Self-pay | Admitting: Cardiology

## 2021-01-17 MED ORDER — DILTIAZEM HCL ER COATED BEADS 180 MG PO CP24: 180.0000 mg | ORAL_CAPSULE | Freq: Every day | ORAL | 3 refills | Status: DC

## 2021-01-17 NOTE — Telephone Encounter (Signed)
STAT if HR is under 50 or over 120 (normal HR is 60-100 beats per minute)  What is your heart rate? 171 When still/sitting 140/150  Do you have a log of your heart rate readings (document readings)?   Do you have any other symptoms? No, just heart beating in chest  Hasn't been taking medicine   Transferred to [facility]

## 2021-01-17 NOTE — Telephone Encounter (Signed)
Spoke with Dr. Azucena Cecil (DOD) and he recommended that the patient take an extra dose of his Metoprolol and if his HR does not come down below 110, he should go to the ER. I also sent a pharmacy request to fill his Diltiazem so he could resume taking. Patient stated that he did not think work was going to let him go. I informed him of the risks if he did not, being that he is not on an anticoagulant as well. Patient stated that he understood. He would follow the recommendations.

## 2021-01-17 NOTE — Telephone Encounter (Signed)
Spoke with patient and he stated that over the last 2 days his HR has been running from 150-171. He is on Diltiazem 180 MG once daily, and Metoprolol Tartrate 25 MG BID. He stated that he lost his bottle of Diltiazem on Saturday while he was working. He thinks it fell out of his pants.  Will route to DOD for recommendations.

## 2021-01-18 NOTE — Telephone Encounter (Signed)
Attempted to call Pt to ensure elevated heart rates had resolved.  No answer, no VM.  Will try again.

## 2021-01-19 NOTE — Telephone Encounter (Addendum)
Attempted to return call to patient.  Received message that the person calling does not have a VM set up.

## 2021-01-26 NOTE — Telephone Encounter (Signed)
Unable to reach Pt.  Letter sent.  See letter.

## 2021-02-02 ENCOUNTER — Telehealth: Payer: Self-pay | Admitting: *Deleted

## 2021-02-02 NOTE — Telephone Encounter (Signed)
Spoke with patient 1 week ago and asked him for his insurance information. He states that he does not have his card with him. He would call me back the next day with the information. I have not heard back from him. I will try him again later today.

## 2021-02-02 NOTE — Telephone Encounter (Signed)
-----   Message from Wiliam Ke, RN sent at 01/13/2021  1:24 PM EDT ----- Regarding: please auth for sleep study Please precert sleep study  [employer]  Boneta Lucks

## 2021-02-03 ENCOUNTER — Telehealth: Payer: Self-pay | Admitting: Cardiology

## 2021-02-03 NOTE — Telephone Encounter (Signed)
Patient calling  Wants to talk about getting a note that he can utilize for his work whenever he is not feeling well Please call to discuss

## 2021-02-04 NOTE — Telephone Encounter (Signed)
Pt is returning call.  

## 2021-02-04 NOTE — Telephone Encounter (Signed)
Unable to contact pt due to his VM not being set up.

## 2021-02-09 ENCOUNTER — Telehealth: Payer: Self-pay | Admitting: Cardiology

## 2021-02-09 NOTE — Telephone Encounter (Signed)
Patient calling  Patient tore MCL and they gave him a medication that may raise his HR  Please call to discuss

## 2021-02-09 NOTE — Telephone Encounter (Signed)
Patient states he went to the Emergency Department for his knee and they prescribed Meloxicam. Please advise if he can take this with his heart medications.

## 2021-02-09 NOTE — Telephone Encounter (Signed)
Discussed with Pt.  Pt has a "bad sprain" of his knee.  Advised if he needs to take meloxicam, it should be with food and for the shortest time possible.  Encouraged Pt to use tylenol instead.  Pt indicates understanding.  Has f/u scheduled for next week to discuss flecainide therapy.  Also need insurance card to complete sleep study work up.  Pt indicates understanding.

## 2021-02-16 ENCOUNTER — Ambulatory Visit: Payer: BC Managed Care – PPO | Admitting: Cardiology

## 2021-02-16 NOTE — Progress Notes (Deleted)
Electrophysiology Office Follow up Visit Note:    Date:  02/16/2021   ID:  Dennis Atkins, DOB [birth year], MRN [id number]  PCP:  Patient, No Pcp Per (Inactive)  [facility] Cardiologist:  None  [facility] Electrophysiologist:  Lanier Prude, MD    Interval History:    Dennis Atkins is a 25 y.o. male who presents for a follow up visit.  I last saw the patient January 12, 2021.  We discussed his A. fib during the last appointment.  We also discussed weight loss.  After our appointment, he called the office with recurrent episodes of atrial fibrillation.  He presents today to discuss antiarrhythmic drug therapy.       Past Medical History:  Diagnosis Date   Tachycardia     Past Surgical History:  Procedure Laterality Date   arm surgery     FRACTURE SURGERY      Current Medications: No outpatient medications have been marked as taking for the 02/16/21 encounter (Appointment) with Lanier Prude, MD.     Allergies:   Patient has no known allergies.   Social History   Socioeconomic History   Marital status: Single    Spouse name: Not on file   Number of children: Not on file   Years of education: Not on file   Highest education level: Not on file  Occupational History   Not on file  Tobacco Use   Smoking status: Some Days    Packs/day: 0.50    Types: Cigarettes   Smokeless tobacco: Never  Vaping Use   Vaping Use: Never used  Substance and Sexual Activity   Alcohol use: Yes    Comment: occ   Drug use: Not Currently   Sexual activity: Not on file  Other Topics Concern   Not on file  Social History Narrative   Not on file   Social Determinants of Health   Financial Resource Strain: Not on file  Food Insecurity: Not on file  Transportation Needs: Not on file  Physical Activity: Not on file  Stress: Not on file  Social Connections: Not on file     Family History: The patient's family history includes Healthy in his father and mother.  ROS:    Please see the history of present illness.    All other systems reviewed and are negative.  EKGs/Labs/Other Studies Reviewed:    The following studies were reviewed today:   EKG:  The ekg ordered today demonstrates ***  Recent Labs: No results found for requested labs within last 8760 hours.  Recent Lipid Panel No results found for: CHOL, TRIG, HDL, CHOLHDL, VLDL, LDLCALC, LDLDIRECT  Physical Exam:    VS:  There were no vitals taken for this visit.    Wt Readings from Last 3 Encounters:  01/12/21 292 lb 9.6 oz (132.7 kg)  12/07/20 282 lb 6 oz (128.1 kg)  03/06/18 248 lb 8 oz (112.7 kg)     GEN: *** Well nourished, well developed in no acute distress HEENT: Normal NECK: No JVD; No carotid bruits LYMPHATICS: No lymphadenopathy CARDIAC: ***RRR, no murmurs, rubs, gallops RESPIRATORY:  Clear to auscultation without rales, wheezing or rhonchi  ABDOMEN: Soft, non-tender, non-distended MUSCULOSKELETAL:  No edema; No deformity  SKIN: Warm and dry NEUROLOGIC:  Alert and oriented x 3 PSYCHIATRIC:  Normal affect        ASSESSMENT:    1. Paroxysmal atrial fibrillation (HCC)   2. Essential hypertension    PLAN:    In order  of problems listed above:           Total time spent with patient today *** minutes. This includes reviewing records, evaluating the patient and coordinating care.   Medication Adjustments/Labs and Tests Ordered: Current medicines are reviewed at length with the patient today.  Concerns regarding medicines are outlined above.  No orders of the defined types were placed in this encounter.  No orders of the defined types were placed in this encounter.    Signed, Steffanie Dunn, MD, [facility] 02/16/2021 9:22 AM    Electrophysiology [facility]

## 2021-03-09 ENCOUNTER — Ambulatory Visit: Payer: BC Managed Care – PPO | Admitting: Cardiology

## 2021-03-10 ENCOUNTER — Encounter: Payer: Self-pay | Admitting: Cardiology

## 2021-04-20 ENCOUNTER — Ambulatory Visit: Payer: BC Managed Care – PPO | Admitting: Cardiology

## 2021-07-19 ENCOUNTER — Encounter (HOSPITAL_COMMUNITY): Payer: Self-pay | Admitting: Emergency Medicine

## 2021-07-19 ENCOUNTER — Emergency Department (HOSPITAL_COMMUNITY)
Admission: EM | Admit: 2021-07-19 | Discharge: 2021-07-19 | Disposition: A | Discharge status: Home / Self Care | Payer: BC Managed Care – PPO | Attending: Student | Admitting: Student

## 2021-07-19 ENCOUNTER — Emergency Department (HOSPITAL_COMMUNITY): Payer: BC Managed Care – PPO

## 2021-07-19 DIAGNOSIS — Z79899 Other long term (current) drug therapy: Secondary | ICD-10-CM | POA: Diagnosis not present

## 2021-07-19 DIAGNOSIS — R0789 Other chest pain: Secondary | ICD-10-CM

## 2021-07-19 DIAGNOSIS — R072 Precordial pain: Secondary | ICD-10-CM | POA: Diagnosis present

## 2021-07-19 LAB — CBC
HCT: 45 % (ref 39.0–52.0)
Hemoglobin: 15.6 g/dL (ref 13.0–17.0)
MCH: 28.5 pg (ref 26.0–34.0)
MCHC: 34.7 g/dL (ref 30.0–36.0)
MCV: 82.3 fL (ref 80.0–100.0)
Platelets: 244 10*3/uL (ref 150–400)
RBC: 5.47 MIL/uL (ref 4.22–5.81)
RDW: 12.9 % (ref 11.5–15.5)
WBC: 8.9 10*3/uL (ref 4.0–10.5)
nRBC: 0 % (ref 0.0–0.2)

## 2021-07-19 LAB — COMPREHENSIVE METABOLIC PANEL
ALT: 29 U/L (ref 0–44)
AST: 27 U/L (ref 15–41)
Albumin: 4.3 g/dL (ref 3.5–5.0)
Alkaline Phosphatase: 59 U/L (ref 38–126)
Anion gap: 8 (ref 5–15)
BUN: 12 mg/dL (ref 6–20)
CO2: 25 mmol/L (ref 22–32)
Calcium: 10 mg/dL (ref 8.9–10.3)
Chloride: 107 mmol/L (ref 98–111)
Creatinine, Ser: 0.92 mg/dL (ref 0.61–1.24)
GFR, Estimated: >60 mL/min (ref >60)
Glucose, Bld: 88 mg/dL (ref 70–99)
Potassium: 3.8 mmol/L (ref 3.5–5.1)
Sodium: 140 mmol/L (ref 135–145)
Total Bilirubin: 0.7 mg/dL (ref 0.3–1.2)
Total Protein: 7.2 g/dL (ref 6.5–8.1)

## 2021-07-19 LAB — TROPONIN I (HIGH SENSITIVITY): Troponin I (High Sensitivity): 4 ng/L (ref <18)

## 2021-07-19 LAB — LIPASE, BLOOD: Lipase: 33 U/L (ref 11–51)

## 2021-07-19 MED ORDER — MELOXICAM 7.5 MG PO TABS: 7.5000 mg | ORAL_TABLET | Freq: Every day | ORAL | 0 refills | Status: AC

## 2021-07-19 NOTE — ED Triage Notes (Signed)
Patient complains of sharp central chest pain that started approximately one month ago, is worse than morning and is exacerbated and reproduced by palpating center of chest. Denies nausea, vomiting, dizziness, and shortness of breath. Patient is alert, oriented, ambulatory, and in no apparent distress at this time. ?

## 2021-07-19 NOTE — Discharge Instructions (Signed)
Please read and follow all provided instructions. ? ?Your diagnoses today include:  ?1. Chest wall pain   ? ? ?Tests performed today include: ?An EKG of your heart ?A chest x-ray ?Cardiac enzymes - a blood test for heart muscle damage ?Blood counts and electrolytes ?Vital signs. See below for your results today.  ? ?Medications prescribed:  ?Meloxicam - anti-inflammatory pain medication ? ?You have been prescribed an anti-inflammatory medication or NSAID. Take with food. Do not take aspirin, ibuprofen, or naproxen if taking this medication. Take smallest effective dose for the shortest duration needed for your pain. Stop taking if you experience stomach pain or vomiting.  ? ?Take any prescribed medications only as directed. ? ?Follow-up instructions: ?Please follow-up with your primary care provider as soon as you can for further evaluation of your symptoms.  ? ?Return instructions:  ?SEEK IMMEDIATE MEDICAL ATTENTION IF: ?You have severe chest pain, especially if the pain is crushing or pressure-like and spreads to the arms, back, neck, or jaw, or if you have sweating, nausea or vomiting, or trouble with breathing. THIS IS AN EMERGENCY. Do not wait to see if the pain will go away. Get medical help at once. Call 911. DO NOT drive yourself to the hospital.  ?Your chest pain gets worse and does not go away after a few minutes of rest.  ?You have an attack of chest pain lasting longer than what you usually experience.  ?You have significant dizziness, if you pass out, or have trouble walking.  ?You have chest pain not typical of your usual pain for which you originally saw your caregiver.  ?You have any other emergent concerns regarding your health. ? ?Additional Information: ?Chest pain comes from many different causes. Your caregiver has diagnosed you as having chest pain that is not specific for one problem, but does not require admission.  You are at low risk for an acute heart condition or other serious illness.   ? ?Your vital signs today were: ?BP 127/79 (BP Location: Left Arm)   Pulse 77   Temp 98.2 ?F (36.8 ?C) (Oral)   Resp 17   SpO2 97%  ?If your blood pressure (BP) was elevated above 135/85 this visit, please have this repeated by your doctor within one month. ?-------------- ? ? ?

## 2021-07-19 NOTE — ED Notes (Signed)
Patient Alert and oriented to baseline. Stable and ambulatory to baseline. Patient verbalized understanding of the discharge instructions.  Patient belongings were taken by the patient.   

## 2021-07-19 NOTE — ED Provider Triage Note (Signed)
Emergency Medicine Provider Triage Evaluation Note ? ?Dennis Atkins , a 26 y.o. male  was evaluated in triage.  Pt complains of chest pain.  Chest pain has been constant over the last month.  Pain has gradually moved from inferior aspect of his sternum to mid sternum.  Pain became worse 2 days ago.  Pain is worse with touch and movement. ? ?Patient denies any fevers, chills, abdominal pain, nausea, vomiting, diarrhea, diaphoresis, palpitations, leg swelling or tenderness, lightheadedness, syncope. ? ?Patient denies any illicit drug use, alcohol, or frequent NSAID use. ? ?Review of Systems  ?Positive: Chest pain ?Negative: See above ? ?Physical Exam  ?BP (!) 147/88 (BP Location: Right Arm)   Pulse 98   Temp 98.1 ?F (36.7 ?C) (Oral)   Resp 16   SpO2 95%  ?Gen:   Awake, no distress   ?Resp:  Normal effort, clear to auscultation bilaterally ?MSK:   Moves extremities without difficulty  ?Other:  +2 radial pulse bilaterally.  Abdomen soft, nondistended, with minimal tenderness to epigastric area.  Patient has reproducible tenderness to sternum. ? ?Medical Decision Making  ?Medically screening exam initiated at 11:39 AM.  Appropriate orders placed.  Dennis Atkins was informed that the remainder of the evaluation will be completed by another provider, this initial triage assessment does not replace that evaluation, and the importance of remaining in the ED until their evaluation is complete. ? ?Due to patient's reports of chest pain will initiate ACS work-up.  Additionally will obtain CMP and lipase to evaluate for abdominal cause of patient's symptoms. ?  ?Loni Beckwith, PA-C ?07/19/21 1140 ? ?

## 2021-07-19 NOTE — ED Provider Notes (Signed)
?[place] ?Provider Note ? ? ?CSN: [id number] ?Arrival date & time: 07/19/21  1111 ? ?  ? ?History ? ?Chief Complaint  ?Patient presents with  ? Chest Pain  ? ? ?Dennis Atkins is a 26 y.o. male. ? ?Patient with history of paroxysmal atrial fibrillation presents to the emergency department today for evaluation of chest pain.  Patient has had the pain for the past 1 week.  He has focal pain along the lower border of the sternum on the right side.  He states that it is worse in the morning and when he twists and turns.  It does not really changed with eating or drinking.  It improves over the course of the day.  No associated shortness of breath or trouble breathing.  No fevers or cough.  Symptoms are not consistent with his spells of atrial fibrillation.  He reports that he has not been taking his A-fib medicine for the past month and a half and feels as though his episodes have become less frequent.  He is a heavy Radio producer.  No treatments prior to arrival. ? ? ?  ? ?Home Medications ?Prior to Admission medications   ?Medication Sig Start Date End Date Taking? Authorizing Provider  ?meloxicam (MOBIC) 7.5 MG tablet Take 1 tablet (7.5 mg total) by mouth daily. 07/19/21  Yes Carlisle Cater, PA-C  ?albuterol (VENTOLIN HFA) 108 (90 Base) MCG/ACT inhaler Inhale 1-2 puffs into the lungs every 4 (four) hours as needed for wheezing or shortness of breath. 01/04/21   Carvel Getting, NP  ?diltiazem (CARDIZEM CD) 180 MG 24 hr capsule Take 1 capsule (180 mg total) by mouth daily. Please refill. Patient has lost his current bottle on 01/15/21 and is now having HR of 171. 01/17/21   Kate Sable, MD  ?metoprolol tartrate (LOPRESSOR) 25 MG tablet Take 1 tablet (25 mg total) by mouth 2 (two) times daily. 12/07/20   Minna Merritts, MD  ?Spacer/Aero-Holding Josiah Lobo DEVI Use with albuterol inhaler 01/04/21   Carvel Getting, NP  ?   ? ?Allergies    ?Patient has no known allergies.    ? ?Review of Systems   ?Review of Systems ? ?Physical Exam ?Updated Vital Signs ?BP 127/79 (BP Location: Left Arm)   Pulse 77   Temp 98.2 ?F (36.8 ?C) (Oral)   Resp 17   SpO2 97%  ?Physical Exam ?Vitals and nursing note reviewed.  ?Constitutional:   ?   Appearance: He is well-developed. He is not diaphoretic.  ?HENT:  ?   Head: Normocephalic and atraumatic.  ?   Mouth/Throat:  ?   Mouth: Mucous membranes are not dry.  ?Eyes:  ?   Conjunctiva/sclera: Conjunctivae normal.  ?Neck:  ?   Vascular: Normal carotid pulses. No carotid bruit or JVD.  ?   Trachea: Trachea normal. No tracheal deviation.  ?Cardiovascular:  ?   Rate and Rhythm: Normal rate and regular rhythm.  ?   Pulses: No decreased pulses.     ?     Radial pulses are 2+ on the right side and 2+ on the left side.  ?   Heart sounds: Normal heart sounds, S1 normal and S2 normal. Heart sounds not distant. No murmur heard. ?Pulmonary:  ?   Effort: Pulmonary effort is normal. No respiratory distress.  ?   Breath sounds: Normal breath sounds. No wheezing.  ?Chest:  ?   Chest wall: Tenderness present.  ?   Comments: Patient with focal  tenderness to the mid chest at the low sternal border, lower.  No skin findings.  No deformities. ?Abdominal:  ?   General: Bowel sounds are normal.  ?   Palpations: Abdomen is soft.  ?   Tenderness: There is no abdominal tenderness. There is no guarding or rebound.  ?Musculoskeletal:  ?   Cervical back: Normal range of motion and neck supple. No muscular tenderness.  ?   Right lower leg: No edema.  ?   Left lower leg: No edema.  ?Skin: ?   General: Skin is warm and dry.  ?   Coloration: Skin is not pale.  ?Neurological:  ?   Mental Status: He is alert. Mental status is at baseline.  ?Psychiatric:     ?   Mood and Affect: Mood normal.  ? ? ?ED Results / Procedures / Treatments   ?Labs ?(all labs ordered are listed, but only abnormal results are displayed) ?Labs Reviewed  ?CBC  ?LIPASE, BLOOD  ?COMPREHENSIVE METABOLIC PANEL   ?TROPONIN I (HIGH SENSITIVITY)  ? ? ?ED ECG REPORT ? ? Date: 07/19/2021 ? Rate: 92 ? Rhythm: normal sinus rhythm ? QRS Axis: normal ? Intervals: normal ? ST/T Wave abnormalities: normal ? Conduction Disutrbances:none ? Narrative Interpretation:  ? Old EKG Reviewed: unchanged ? ?I have personally reviewed the EKG tracing and agree with the computerized printout as noted. ? ? ?Radiology ?DG Chest 2 View ? ?Result Date: 07/19/2021 ?CLINICAL DATA:  Acute chest pain EXAM: CHEST - 2 VIEW COMPARISON:  03/02/2018 FINDINGS: The heart size and mediastinal contours are within normal limits. Both lungs are clear. The visualized skeletal structures are unremarkable. IMPRESSION: No active cardiopulmonary disease. Electronically Signed   By: Jerilynn Mages.  Shick M.D.   On: 07/19/2021 11:54   ? ?Procedures ?Procedures  ? ? ?Medications Ordered in ED ?Medications - No data to display ? ?ED Course/ Medical Decision Making/ A&P ?  ?Patient seen and examined. History obtained directly from patient.  Also reviewed external cardiology notes.  Work-up including labs, imaging, EKG ordered in triage, if performed, were reviewed.   ? ?Labs/EKG: Independently reviewed and interpreted.  This included: DBC unremarkable; CMP unremarkable; lipase normal; troponin 4. ? ?Imaging: Independently reviewed and interpreted.  This included: Chest x-ray, agree negative. ? ?Medications/Fluids: None ordered ? ?Most recent vital signs reviewed and are as follows: ?BP 127/79 (BP Location: Left Arm)   Pulse 77   Temp 98.2 ?F (36.8 ?C) (Oral)   Resp 17   SpO2 97%  ? ?Initial impression: Chest wall pain ? ?Home treatment plan: Meloxicam x10 days ? ?Return instructions discussed with patient: Return and follow-up instructions: I encouraged patient to return to ED with severe chest pain, especially if the pain is crushing or pressure-like and spreads to the arms, back, neck, or jaw, or if they have associated sweating, vomiting, or shortness of breath with the pain, or  significant pain with activity. We discussed that the evaluation here today indicates a low-risk of serious cause of chest pain, including heart trouble or a blood clot, but no evaluation is perfect and chest pain can evolve with time. The patient verbalized understanding and agreed.  I encouraged patient to follow-up with their provider in the next 48 hours for recheck.   ? ? ?Follow-up instructions discussed with patient: Follow-up with PCP and or his cardiologist if symptoms persist despite treatment ? ?                        ?  Medical Decision Making ?Amount and/or Complexity of Data Reviewed ?Labs: ordered. ?Radiology: ordered. ? ?Risk ?Prescription drug management. ? ? ?For this patient's complaint of chest pain, the following emergent conditions were considered on the differential diagnosis: acute coronary syndrome, pulmonary embolism, pneumothorax, myocarditis, pericardial tamponade, aortic dissection, thoracic aortic aneurysm complication, esophageal perforation.  ? ?Other causes were also considered including: gastroesophageal reflux disease, musculoskeletal pain including costochondritis, pneumonia/pleurisy, herpes zoster, pericarditis. ? ?In regards to possibility of ACS, patient has atypical features of pain, non-ischemic and unchanged EKG and negative troponin(s). Heart score was calculated to be 2.  ? ?In regards to possibility of PE, symptoms are atypical for PE and patient is PERC negative and chance of PE < 2%.  ? ?The patient's vital signs, pertinent lab work and imaging were reviewed and interpreted as discussed in the ED course. Hospitalization was considered for further testing, treatments, or serial exams/observation. However as patient is well-appearing, has a stable exam, and reassuring studies today, I do not feel that they warrant admission at this time. This plan was discussed with the patient who verbalizes agreement and comfort with this plan and seems reliable and able to return to the  Emergency Department with worsening or changing symptoms.  ? ? ? ? ? ? ? ? ?Final Clinical Impression(s) / ED Diagnoses ?Final diagnoses:  ?Chest wall pain  ? ? ?Rx / DC Orders ?ED Discharge Orders   ? ?      Ordered

## 2023-02-13 ENCOUNTER — Ambulatory Visit: Payer: PRIVATE HEALTH INSURANCE | Attending: Cardiology | Admitting: Cardiology

## 2023-02-13 ENCOUNTER — Telehealth: Payer: Self-pay | Admitting: Cardiovascular Disease

## 2023-02-13 ENCOUNTER — Telehealth: Payer: Self-pay | Admitting: Cardiology

## 2023-02-13 VITALS — BP 140/72 | Ht 69.0 in | Wt 270.8 lb

## 2023-02-13 DIAGNOSIS — G4730 Sleep apnea, unspecified: Secondary | ICD-10-CM | POA: Diagnosis not present

## 2023-02-13 DIAGNOSIS — I480 Paroxysmal atrial fibrillation: Secondary | ICD-10-CM | POA: Diagnosis not present

## 2023-02-13 MED ORDER — DILTIAZEM HCL ER COATED BEADS 180 MG PO CP24: 180.0000 mg | ORAL_CAPSULE | Freq: Every day | ORAL | 1 refills | Status: DC

## 2023-02-13 NOTE — Patient Instructions (Addendum)
Medication Instructions:  No changes *If you need a refill on your cardiac medications before your next appointment, please call your pharmacy*   Lab Work: None ordered If you have labs (blood work) drawn today and your tests are completely normal, you will receive your results only by: MyChart Message (if you have MyChart) OR A paper copy in the mail If you have any lab test that is abnormal or we need to change your treatment, we will call you to review the results.   Testing/Procedures: We have ordered a Watchpat sleep study for you. Please do not start the study until oyu have heard from [place] and we will then give you the special code.    Follow-Up: At [facility], you and your health needs are our priority.  As part of our continuing mission to provide you with exceptional heart care, we have created designated Provider Care Teams.  These Care Teams include your primary Cardiologist (physician) and Advanced Practice Providers (APPs -  Physician Assistants and Nurse Practitioners) who all work together to provide you with the care you need, when you need it.  We recommend signing up for the patient portal called "MyChart".  Sign up information is provided on this After Visit Summary.  MyChart is used to connect with patients for Virtual Visits (Telemedicine).  Patients are able to view lab/test results, encounter notes, upcoming appointments, etc.  Non-urgent messages can be sent to your provider as well.   To learn more about what you can do with MyChart, go to [email or web address].    Your next appointment:   Telephone visit with Dennis Don, NP on Thursday or Friday  Other Instructions WatchPAT?  Is a FDA cleared portable home sleep study test that uses a watch and 3 points of contact to monitor 7 different channels, including your heart rate, oxygen saturations, body position, snoring, and chest motion.  The study is easy to use from the comfort of your own home and  accurately detect sleep apnea.  Before bed, you attach the chest sensor, attached the sleep apnea bracelet to your nondominant hand, and attach the finger probe.  After the study, the raw data is downloaded from the watch and scored for apnea events.   For more information: [email or web address]  Patient Testing Instructions:  Do not put battery into the device until bedtime when you are ready to begin the test. Please call the support number if you need assistance after following the instructions below: 24 hour support line- [phone] or [facility] support at [phone] (option 2)  Download the IntelWatchPAT One" app through the google play store or App Store  Be sure to turn on or enable access to bluetooth in settlings on your smartphone/ device  Make sure no other bluetooth devices are on and within the vicinity of your smartphone/ device and WatchPAT watch during testing.  Make sure to leave your smart phone/ device plugged in and charging all night.  When ready for bed:  Follow the instructions step by step in the WatchPAT One App to activate the testing device. For additional instructions, including video instruction, visit the WatchPAT One video on Youtube. You can search for WatchPat One within Youtube (video is 4 minutes and 18 seconds) or enter: https://youtube/watch?v=BCce_vbiwxE Please note: You will be prompted to enter a Pin to connect via bluetooth when starting the test. The PIN will be assigned to you when you receive the test.  The device is disposable, but it recommended that you retain  the device until you receive a call letting you know the study has been received and the results have been interpreted.  We will let you know if the study did not transmit to [place] properly after the test is completed. You do not need to call us to confirm the receipt of the test.  Please complete the test within 48 hours of receiving PIN.   Frequently Asked Questions:  What  is Watch Dennie Bible one?  A single use fully disposable home sleep apnea testing device and will not need to be returned after completion.  What are the requirements to use WatchPAT one?  The be able to have a successful watchpat one sleep study, you should have your Watch pat one device, your smart phone, watch pat one app, your PIN number and Internet access What type of phone do I need?  You should have a smart phone that uses Android 5.1 and above or any Iphone with IOS 10 and above How can I download the WatchPAT one app?  Based on your device type search for WatchPAT one app either in google play for android devices or APP store for Iphone's Where will I get my PIN for the study?  Your PIN will be provided by your physician's office. It is used for authentication and if you lose/forget your PIN, please reach out to your providers office.  I do not have Internet at home. Can I do WatchPAT one study?  WatchPAT One needs Internet connection throughout the night to be able to transmit the sleep data. You can use your home/local internet or your cellular's data package. However, it is always recommended to use home/local Internet. It is estimated that between 20MB-30MB will be used with each study.However, the application will be looking for space in the phone to start the study.  What happens if I lose internet or bluetooth connection?  During the internet disconnection, your phone will not be able to transmit the sleep data. All the data, will be stored in your phone. As soon as the internet connection is back on, the phone will being sending the sleep data. During the bluetooth disconnection, WatchPAT one will not be able to to send the sleep data to your phone. Data will be kept in the [facility] one until two devices have bluetooth connection back on. As soon as the connection is back on, WatchPAT one will send the sleep data to the phone.  How long do I need to wear the WatchPAT one?  After you  start the study, you should wear the device at least 6 hours.  How far should I keep my phone from the device?  During the night, your phone should be within 15 feet.  What happens if I leave the room for restroom or other reasons?  Leaving the room for any reason will not cause any problem. As soon as your get back to the room, both devices will reconnect and will continue to send the sleep data. Can I use my phone during the sleep study?  Yes, you can use your phone as usual during the study. But it is recommended to put your watchpat one on when you are ready to go to bed.  How will I get my study results?  A soon as you completed your study, your sleep data will be sent to the provider. They will then share the results with you when they are ready.

## 2023-02-13 NOTE — Telephone Encounter (Signed)
Patient called complaining of elevated heart rate since this morning. The patient stated that after jumping out of bed upon realizing he was running late, his heart rate has been elevated. The last time he checked, it was 143. He reported that he is currently at a store and unable to provide an updated reading. The patient denies feeling lightheaded or dizzy but reports experiencing some shortness of breath and a headache.  The patient also stated that he is not currently taking any cardiac medications, explaining that he left them in his lunchbox and they melted.  An appointment is scheduled for today, 02/13/23, at 1:00 p.m. with Sherie Don, NP, for further evaluation.

## 2023-02-13 NOTE — Telephone Encounter (Signed)
STAT if HR is under 50 or over 120 (normal HR is 60-100 beats per minute)  What is your heart rate? 143  Do you have a log of your heart rate readings (document readings)? No   Do you have any other symptoms? Headache,

## 2023-02-13 NOTE — Progress Notes (Signed)
Cardiology Office Note Date:  02/13/2023  Patient ID:  Dennis Atkins, DOB 11-Apr-1995, MRN [id number] PCP:  Patient, No Pcp Per  Cardiologist:  None Electrophysiologist: Lanier Prude, MD    Chief Complaint: tachypalpitations  History of Present Illness: Dennis Atkins is a 27 y.o. male with PMH notable for parox AFib; seen today for Lanier Prude, MD for acute visit due to palpitations.   He last saw Dr. Lalla Brothers 01/2021 for initial EP consult. He was continuing to have afib episodes on dilt and metop, always in the morning. Recommended sleep study and weight loss. He has not followed up since that time with cardiology.   He called clinic earlier today with elevated HR and palpitations.   He says that he woke up this morning startled and running late, and noticed his HR was very fast.  He usually takes medication during episodes, but meds were in a lunchbox and got wet. He has had palpitation episodes every 2-3 months or so, always starting in the morning and usually last a few hours. They are not usually as noticeable as today's episode, which is why he called for appt. He has some SOB with episodes, but main symptom is palpitations.  He denies chest pain, chest pressure, dizziness, LH or presyncope. No edema, good appetite.   He continues to snore nightly, wakes up with morning HA and has daytime somnolence.   AAD History: None   Past Medical History:  Diagnosis Date   Tachycardia     Past Surgical History:  Procedure Laterality Date   arm surgery     FRACTURE SURGERY      Current Outpatient Medications  Medication Instructions   albuterol (VENTOLIN HFA) 108 (90 Base) MCG/ACT inhaler 1-2 puffs, Inhalation, Every 4 hours PRN   diltiazem (CARDIZEM CD) 180 mg, Oral, Daily, Please refill. Patient has lost his current bottle on 01/15/21 and is now having HR of 171.   meloxicam (MOBIC) 7.5 mg, Oral, Daily   metoprolol tartrate (LOPRESSOR) 25 mg, Oral, 2 times daily    Spacer/Aero-Holding Chambers DEVI Use with albuterol inhaler    Social History:  The patient  reports that he has been smoking cigarettes. He has never used smokeless tobacco. He reports current alcohol use. He reports that he does not currently use drugs.     ROS:  Please see the history of present illness. All other systems are reviewed and otherwise negative.   PHYSICAL EXAM:  VS:  BP (!) 140/72 (BP Location: Left Arm, Patient Position: Sitting, Cuff Size: Normal)   Ht 5\' 9"  (1.753 m)   Wt 270 lb 12.8 oz (122.8 kg)   SpO2 97%   BMI 39.99 kg/m  BMI: Body mass index is 39.99 kg/m.  GEN- The patient is well appearing, alert and oriented x 3 today.   Lungs- Clear to ausculation bilaterally, normal work of breathing.  Heart- Irregularly irregular rate and rhythm, no murmurs, rubs or gallops Extremities- No peripheral edema, warm, dry   EKG is ordered. Personal review of EKG from today shows:    EKG Interpretation Date/Time:  Tuesday February 13 2023 13:20:23 EST Ventricular Rate:  146 PR Interval:    QRS Duration:  84 QT Interval:  296 QTC Calculation: 461 R Axis:   8  Text Interpretation: Atrial fibrillation with rapid ventricular response Confirmed by Sherie Don [phone]) on 02/13/2023 1:22:33 PM    Recent Labs: No results found for requested labs within last 365 days.  No results found for  requested labs within last 365 days.   CrCl cannot be calculated (Patient's most recent lab result is older than the maximum 21 days allowed.).   Wt Readings from Last 3 Encounters:  02/13/23 270 lb 12.8 oz (122.8 kg)  01/12/21 292 lb 9.6 oz (132.7 kg)  12/07/20 282 lb 6 oz (128.1 kg)     Additional studies reviewed include: Previous EP, cardiology notes.     ASSESSMENT AND PLAN:  #) palpitations #) parox afib Continues to have short, symptomatic episodes every 2-3 months, usually self-resolve Will hold off on scheduling DCCV at this time given the paroxysmal nature of  his AF episodes Restart 180mg  dilt daily Close follow-up later this week to re-eval.  CHA2DS2-VASc Score = 0  No OAC given low chads-vasc score   #) sleep disordered breathing StopBang score of 6 Recommended watchpat to eval for OSA       Current medicines are reviewed at length with the patient today.   The patient does not have concerns regarding his medicines.  The following changes were made today:  none  Labs/ tests ordered today include:  Orders Placed This Encounter  Procedures   EKG 12-Lead     Disposition: Follow up with EP APP   in 2-3 days to re-eval    Signed, Sherie Don, NP  02/13/23  1:41 PM  Electrophysiology [facility]

## 2023-02-13 NOTE — Telephone Encounter (Signed)
  Patient Consent for Virtual Visit        Dennis Atkins has provided verbal consent on 02/13/2023 for a virtual visit (video or telephone).   CONSENT FOR VIRTUAL VISIT FOR:  Dennis Atkins  By participating in this virtual visit I agree to the following:  I hereby voluntarily request, consent and authorize [facility] and its employed or contracted physicians, physician assistants, nurse practitioners or other licensed health care professionals (the Practitioner), to provide me with telemedicine health care services (the "Services") as deemed necessary by the treating Practitioner. I acknowledge and consent to receive the Services by the Practitioner via telemedicine. I understand that the telemedicine visit will involve communicating with the Practitioner through live audiovisual communication technology and the disclosure of certain medical information by electronic transmission. I acknowledge that I have been given the opportunity to request an in-person assessment or other available alternative prior to the telemedicine visit and am voluntarily participating in the telemedicine visit.  I understand that I have the right to withhold or withdraw my consent to the use of telemedicine in the course of my care at any time, without affecting my right to future care or treatment, and that the Practitioner or I may terminate the telemedicine visit at any time. I understand that I have the right to inspect all information obtained and/or recorded in the course of the telemedicine visit and may receive copies of available information for a reasonable fee.  I understand that some of the potential risks of receiving the Services via telemedicine include:  Delay or interruption in medical evaluation due to technological equipment failure or disruption; Information transmitted may not be sufficient (e.g. poor resolution of images) to allow for appropriate medical decision making by the Practitioner;  and/or  In rare instances, security protocols could fail, causing a breach of personal health information.  Furthermore, I acknowledge that it is my responsibility to provide information about my medical history, conditions and care that is complete and accurate to the best of my ability. I acknowledge that Practitioner's advice, recommendations, and/or decision may be based on factors not within their control, such as incomplete or inaccurate data provided by me or distortions of diagnostic images or specimens that may result from electronic transmissions. I understand that the practice of medicine is not an exact science and that Practitioner makes no warranties or guarantees regarding treatment outcomes. I acknowledge that a copy of this consent can be made available to me via my patient portal [facility] MyChart), or I can request a printed copy by calling the office of [facility].    I understand that my insurance will be billed for this visit.   I have read or had this consent read to me. I understand the contents of this consent, which adequately explains the benefits and risks of the Services being provided via telemedicine.  I have been provided ample opportunity to ask questions regarding this consent and the Services and have had my questions answered to my satisfaction. I give my informed consent for the services to be provided through the use of telemedicine in my medical care

## 2023-02-14 NOTE — Progress Notes (Unsigned)
Virtual Visit via Telephone Note   Because of Nate Common co-morbid illnesses, he is at least at moderate risk for complications without adequate follow up.  This format is felt to be most appropriate for this patient at this time.  The patient did not have access to video technology/had technical difficulties with video requiring transitioning to audio format only (telephone).  All issues noted in this document were discussed and addressed.  No physical exam could be performed with this format.  Please refer to the patient's chart for his consent to telehealth for [facility].    Date:  02/15/2023   ID:  Dennis Atkins, DOB 03/14/1996, MRN [id number] The patient was identified using 2 identifiers.  Patient Location: Other:  work in [place], [place] Provider Location: Office/Clinic   PCP:  Patient, No Pcp Per   [employer] Providers Cardiologist:  None Electrophysiologist:  Lanier Prude, MD     Evaluation Performed:  Follow-Up Visit  Chief Complaint:  afib  History of Present Illness:    Dennis Atkins is a 27 y.o. male  with PMH notable for parox AFib; seen today for Lanier Prude, MD for EP follow up .   He last saw Dr. Lalla Brothers 01/2021 for initial EP consult. He was continuing to have afib episodes on dilt and metop, always in the morning. Recommended sleep study and weight loss. He has not followed up since that time with cardiology.  He called clinic earlier this week with concerns for elevated HR and palpitations. He had had somewhat regular palpitation episodes every 2-3 months, but they typically resolved quickly. EKG showed afib w RVR. He had run out of his dilt, this was refilled. He was HDS and feeling relatively well, so did not favor DCCV at that time. He is not anti-coagulated d/t low chadsvasc.  Close follow-up scheduled to further eval  Today, patient is feeling well, denies chest pressure, palpitations, chest pain. After our visit, he  went home and went to sleep, thinks AFib episode stopped that same night around 9pm. He took dilt the following day and has taken daily since. Denies dizziness, lightheadedness or presyncope.    Past Medical History:  Diagnosis Date   Tachycardia    Past Surgical History:  Procedure Laterality Date   arm surgery     FRACTURE SURGERY       Current Meds  Medication Sig   diltiazem (CARDIZEM CD) 180 MG 24 hr capsule Take 1 capsule (180 mg total) by mouth daily.   meloxicam (MOBIC) 7.5 MG tablet Take 1 tablet (7.5 mg total) by mouth daily.   metoprolol tartrate (LOPRESSOR) 25 MG tablet Take 1 tablet (25 mg total) by mouth 2 (two) times daily.   Spacer/Aero-Holding Rudean Curt Use with albuterol inhaler     Allergies:   Patient has no known allergies.   Social History   Tobacco Use   Smoking status: Some Days    Current packs/day: 0.50    Types: Cigarettes   Smokeless tobacco: Never  Vaping Use   Vaping status: Never Used  Substance Use Topics   Alcohol use: Yes    Comment: occ   Drug use: Not Currently     Family Hx: The patient's family history includes Healthy in his father and mother.  ROS:   Please see the history of present illness.     All other systems reviewed and are negative.   Prior CV studies:   The following studies were reviewed today:  none  Labs/Other Tests and Data Reviewed:    EKG:  An ECG dated 02/13/2023 was personally reviewed today and demonstrated:  afib w RVR  Recent Labs: No results found for requested labs within last 365 days.   Recent Lipid Panel No results found for: "CHOL", "TRIG", "HDL", "CHOLHDL", "LDLCALC", "LDLDIRECT"  Wt Readings from Last 3 Encounters:  02/15/23 270 lb (122.5 kg)  02/13/23 270 lb 12.8 oz (122.8 kg)  01/12/21 292 lb 9.6 oz (132.7 kg)     Risk Assessment/Calculations:    CHA2DS2-VASc Score = 0   This indicates a 0.2% annual risk of stroke. The patient's score is based upon: CHF History: 0 HTN  History: 0 Diabetes History: 0 Stroke History: 0 Vascular Disease History: 0 Age Score: 0 Gender Score: 0      STOP-Bang Score:  6      Objective:    Vital Signs:  Ht 5\' 9"  (1.753 m)   Wt 270 lb (122.5 kg)   BMI 39.87 kg/m      ASSESSMENT & PLAN:    #) palpitations #) parox afib Continues to have short, symptomatic episodes every 2-3 months, usually self-resolve Most recent AFib episode aerlier this week was longer in duration and more symptomatic, but was still paroxysm and he self-converted Continue 180 dilt daily TTE to further eval CHA2DS2-VASc Score = 0  No OAC given low chads-vasc score  #) sleep disordered breathing Watchpat eval in progress, awaiting insurance auth           Time:   Today, I have spent 6 minutes with the patient with telehealth technology discussing the above problems.     Medication Adjustments/Labs and Tests Ordered: Current medicines are reviewed at length with the patient today.  Concerns regarding medicines are outlined above.   Tests Ordered: Orders Placed This Encounter  Procedures   ECHOCARDIOGRAM COMPLETE    Medication Changes: No orders of the defined types were placed in this encounter.   Follow Up:  In Person in 6 month(s)  Signed, Sherie Don, NP  02/15/2023 1:18 PM    [facility]

## 2023-02-15 ENCOUNTER — Ambulatory Visit: Payer: PRIVATE HEALTH INSURANCE | Attending: Cardiology | Admitting: Cardiology

## 2023-02-15 ENCOUNTER — Encounter: Payer: Self-pay | Admitting: Cardiology

## 2023-02-15 VITALS — Ht 69.0 in | Wt 270.0 lb

## 2023-02-15 DIAGNOSIS — I480 Paroxysmal atrial fibrillation: Secondary | ICD-10-CM

## 2023-02-15 NOTE — Patient Instructions (Signed)
Medication Instructions:  The current medical regimen is effective;  continue present plan and medications.  *If you need a refill on your cardiac medications before your next appointment, please call your pharmacy*   Testing/Procedures:  Your physician has requested that you have an echocardiogram. Echocardiography is a painless test that uses sound waves to create images of your heart. It provides your doctor with information about the size and shape of your heart and how well your heart's chambers and valves are working.   You may receive an ultrasound enhancing agent through an IV if needed to better visualize your heart during the echo. This procedure takes approximately one hour.  There are no restrictions for this procedure.  This will take place at [address] [facility] [address], [place] [address]  Please note: We ask at that you not bring children with you during ultrasound (echo/ vascular) testing. Due to room size and safety concerns, children are not allowed in the ultrasound rooms during exams. Our front office staff cannot provide observation of children in our lobby area while testing is being conducted. An adult accompanying a patient to their appointment will only be allowed in the ultrasound room at the discretion of the ultrasound technician under special circumstances. We apologize for any inconvenience.    Follow-Up: At [facility], you and your health needs are our priority.  As part of our continuing mission to provide you with exceptional heart care, we have created designated Provider Care Teams.  These Care Teams include your primary Cardiologist (physician) and Advanced Practice Providers (APPs -  Physician Assistants and Nurse Practitioners) who all work together to provide you with the care you need, when you need it.  We recommend signing up for the patient portal called "MyChart".  Sign up information is provided on this After Visit  Summary.  MyChart is used to connect with patients for Virtual Visits (Telemedicine).  Patients are able to view lab/test results, encounter notes, upcoming appointments, etc.  Non-urgent messages can be sent to your provider as well.   To learn more about what you can do with MyChart, go to [email or web address].    Your next appointment:   6 month(s)  Provider:   Sherie Don, NP

## 2023-02-16 ENCOUNTER — Telehealth: Payer: Self-pay | Admitting: *Deleted

## 2023-02-16 NOTE — Telephone Encounter (Signed)
-----   Message from Nurse Misty Stanley R sent at 02/13/2023  2:51 PM EST ----- WatchPat has been ordered on this patient. Stop Brennan Bailey was a 6. Thank you

## 2023-03-12 ENCOUNTER — Telehealth: Payer: Self-pay | Admitting: Cardiovascular Disease

## 2023-03-12 NOTE — Telephone Encounter (Signed)
Left voicemail. Pt's echo needs to be rescheduled due to not having insurance info needed for prior auth.

## 2023-03-13 ENCOUNTER — Ambulatory Visit: Payer: PRIVATE HEALTH INSURANCE

## 2024-01-11 ENCOUNTER — Other Ambulatory Visit: Payer: Self-pay

## 2024-01-11 ENCOUNTER — Telehealth: Payer: Self-pay | Admitting: Cardiovascular Disease

## 2024-01-11 MED ORDER — DILTIAZEM HCL ER COATED BEADS 180 MG PO CP24: 180.0000 mg | ORAL_CAPSULE | Freq: Every day | ORAL | 0 refills | Status: AC

## 2024-01-11 NOTE — Telephone Encounter (Signed)
 Called pt regarding STAT call at 1245. Patient states he woke up with a fast heart rate this morning accompanied by dizziness. He went on to work after it subsided and then had another bout of dizziness while at work after lunch accompanied by the dizziness. Denies shortness of breath. Patient has seen EP in the past and was due for a 6 month follow up in May however it was never made. Inquired about patient being on cardizem  per last note by Suzann to continue diltiazem . Patient states he hasn't took this in awhile because he never got it refilled. I refill medication for patient since this could help with his fast heart rate and he will go pick it up. No openings available today to be seen, I recommended with patient's symptoms along with fast heart rate and history of a fib to head to the ER to get evaluated. Patient verbalized understanding and said he would go to the ED. No other questions at this time.

## 2024-01-11 NOTE — Telephone Encounter (Signed)
 Pt c/o BP issue: STAT if pt c/o blurred vision, one-sided weakness or slurred speech.  STAT if BP is GREATER than 180/120 TODAY.  STAT if BP is LESS than 90/60 and SYMPTOMATIC TODAY  1. What is your BP concern?   Patient is concerned his BP and HR reading have been trending high  2. Have you taken any BP medication today?  Yes  3. What are your last 5 BP readings?  BP 126/60    4. Are you having any other symptoms (ex. Dizziness, headache, blurred vision, passed out)?  Nausea, headache   Patient is concerned his BP and HR has been trending high.  Patient stated he feels like he is going to pass out.

## 2024-01-25 ENCOUNTER — Ambulatory Visit: Payer: Self-pay | Attending: Cardiology | Admitting: Cardiology

## 2024-01-25 NOTE — Progress Notes (Deleted)
 Electrophysiology Clinic Note    Date:  01/25/2024  Patient ID:  Dennis Atkins, Dennis Atkins [birth year], MRN [id number] PCP:  Patient, No Pcp Per  Cardiologist:  None  Electrophysiologist:  OLE ONEIDA HOLTS, MD  Electrophysiology APP:  Ellison Leisure, NP    ***refresh  Discussed the use of AI scribe software for clinical note transcription with the patient, who gave verbal consent to proceed.   Patient Profile    Chief Complaint: ***  History of Present Illness: Dennis Atkins is a 28 y.o. male with PMH notable for parox AFib, palpitations, ; seen today for OLE ONEIDA HOLTS, MD for acute visit due to palpitations.    He last saw Dr. HOLTS 01/2021 for initial EP consult. He was continuing to have afib episodes on dilt and metop, always in the morning. Recommended sleep study and weight loss.  I next saw the patient 02/2023 for acute visit for tachy-palpitations. He had run out of dilt. Episode that morning was particularly more symptomatic. Refilled meds, and again recommended sleep study. Also ordered TTE, which has not been done. Close follow-up later that same week, he was back in sinus rhythm.   He called clinic 10/3 with tachycardia and dizziness, had run out of dilt and metop again. These were refilled, and recommend to have eval in ER for symptoms.   On follow-up today, *** AF burden, symptoms *** palpitations *** bleeding concerns   Patient reports ***.  he denies chest pain, palpitations, dyspnea, PND, orthopnea, nausea, vomiting, dizziness, syncope, edema, weight gain, or early satiety.      Arrhythmia/Device History No specialty comments available.    ROS:  Please see the history of present illness. All other systems are reviewed and otherwise negative.    Physical Exam    VS:  There were no vitals taken for this visit. BMI: There is no height or weight on file to calculate BMI.    STOP-Bang Score:  6  { Consider Dx Sleep Disordered Breathing or Sleep  Apnea  ICD G47.33          :1}       Wt Readings from Last 3 Encounters:  02/15/23 270 lb (122.5 kg)  02/13/23 270 lb 12.8 oz (122.8 kg)  01/12/21 292 lb 9.6 oz (132.7 kg)     GEN- The patient is well appearing, alert and oriented x 3 today.   Lungs- Clear to ausculation bilaterally, normal work of breathing.  Heart- {Blank single:19197::Regular,Irregularly irregular} rate and rhythm, no murmurs, rubs or gallops Extremities- {EDEMA LEVEL:28147::No} peripheral edema, warm, dry   Studies Reviewed   Previous EP, cardiology notes.    EKG is ordered. Personal review of EKG from today shows:  ***         Assessment and Plan     #) parox AFib #) palpitations     #) sleep disordered breathing   #) ***   {Are you ordering a CV Procedure (e.g. stress test, cath, DCCV, TEE, etc)?   Press F2        :[id number]}   Current medicines are reviewed at length with the patient today.   The patient {ACTIONS; HAS/DOES NOT HAVE:19233} concerns regarding his medicines.  The following changes were made today:  {NONE DEFAULTED:18576}  Labs/ tests ordered today include: *** No orders of the defined types were placed in this encounter.    Disposition: Follow up with {EPMDS:28135::EP Team} or EP APP {EPFOLLOW UP:28173}   Signed, Clotilda Hafer, NP  01/25/24  8:18 AM  Electrophysiology [facility]
